# Patient Record
Sex: Female | Born: 1975 | Race: Black or African American | Hispanic: No | Marital: Married | Smoking: Never smoker
Health system: Southern US, Community
[De-identification: ages and names within clinical notes are randomized; demographics above are authoritative.]

## PROBLEM LIST (undated history)

## (undated) DIAGNOSIS — J45909 Unspecified asthma, uncomplicated: Secondary | ICD-10-CM

## (undated) DIAGNOSIS — D259 Leiomyoma of uterus, unspecified: Secondary | ICD-10-CM

## (undated) DIAGNOSIS — M1990 Unspecified osteoarthritis, unspecified site: Secondary | ICD-10-CM

## (undated) DIAGNOSIS — Z9109 Other allergy status, other than to drugs and biological substances: Secondary | ICD-10-CM

## (undated) DIAGNOSIS — Z973 Presence of spectacles and contact lenses: Secondary | ICD-10-CM

## (undated) DIAGNOSIS — T7840XA Allergy, unspecified, initial encounter: Secondary | ICD-10-CM

## (undated) HISTORY — DX: Allergy, unspecified, initial encounter: T78.40XA

## (undated) HISTORY — DX: Unspecified osteoarthritis, unspecified site: M19.90

## (undated) HISTORY — PX: LIPOMA EXCISION: SHX5283

## (undated) HISTORY — DX: Presence of spectacles and contact lenses: Z97.3

## (undated) HISTORY — PX: OTHER SURGICAL HISTORY: SHX169

## (undated) HISTORY — PX: TUBAL LIGATION: SHX77

## (undated) HISTORY — DX: Unspecified asthma, uncomplicated: J45.909

## (undated) HISTORY — PX: WRIST SURGERY: SHX841

## (undated) HISTORY — DX: Leiomyoma of uterus, unspecified: D25.9

---

## 1998-07-13 ENCOUNTER — Other Ambulatory Visit: Admission: RE | Admit: 1998-07-13 | Discharge: 1998-07-13 | Payer: Self-pay | Admitting: Obstetrics and Gynecology

## 1998-07-28 ENCOUNTER — Ambulatory Visit (HOSPITAL_COMMUNITY): Admission: RE | Admit: 1998-07-28 | Discharge: 1998-07-28 | Payer: Self-pay | Admitting: Obstetrics and Gynecology

## 1998-07-28 ENCOUNTER — Encounter: Payer: Self-pay | Admitting: Obstetrics and Gynecology

## 1998-12-10 ENCOUNTER — Inpatient Hospital Stay (HOSPITAL_COMMUNITY): Admission: AD | Admit: 1998-12-10 | Discharge: 1998-12-13 | Payer: Self-pay | Admitting: Obstetrics and Gynecology

## 1998-12-10 ENCOUNTER — Encounter (HOSPITAL_COMMUNITY): Admission: RE | Admit: 1998-12-10 | Discharge: 1998-12-13 | Payer: Self-pay | Admitting: Obstetrics and Gynecology

## 2001-08-28 ENCOUNTER — Other Ambulatory Visit: Admission: RE | Admit: 2001-08-28 | Discharge: 2001-08-28 | Payer: Self-pay | Admitting: Family Medicine

## 2002-08-21 ENCOUNTER — Encounter (INDEPENDENT_AMBULATORY_CARE_PROVIDER_SITE_OTHER): Payer: Self-pay | Admitting: Specialist

## 2002-08-21 ENCOUNTER — Ambulatory Visit (HOSPITAL_BASED_OUTPATIENT_CLINIC_OR_DEPARTMENT_OTHER): Admission: RE | Admit: 2002-08-21 | Discharge: 2002-08-21 | Payer: Self-pay | Admitting: Orthopedic Surgery

## 2005-08-20 ENCOUNTER — Emergency Department (HOSPITAL_COMMUNITY): Admission: EM | Admit: 2005-08-20 | Discharge: 2005-08-20 | Payer: Self-pay | Admitting: Family Medicine

## 2005-12-18 ENCOUNTER — Emergency Department (HOSPITAL_COMMUNITY): Admission: EM | Admit: 2005-12-18 | Discharge: 2005-12-18 | Payer: Self-pay | Admitting: Emergency Medicine

## 2006-04-23 ENCOUNTER — Other Ambulatory Visit: Admission: RE | Admit: 2006-04-23 | Discharge: 2006-04-23 | Payer: Self-pay | Admitting: Gynecology

## 2006-08-21 ENCOUNTER — Emergency Department (HOSPITAL_COMMUNITY): Admission: EM | Admit: 2006-08-21 | Discharge: 2006-08-21 | Payer: Self-pay | Admitting: Family Medicine

## 2007-01-02 ENCOUNTER — Emergency Department (HOSPITAL_COMMUNITY): Admission: EM | Admit: 2007-01-02 | Discharge: 2007-01-02 | Payer: Self-pay | Admitting: Emergency Medicine

## 2008-04-13 ENCOUNTER — Emergency Department (HOSPITAL_COMMUNITY): Admission: EM | Admit: 2008-04-13 | Discharge: 2008-04-13 | Payer: Self-pay | Admitting: Family Medicine

## 2008-08-25 ENCOUNTER — Emergency Department (HOSPITAL_COMMUNITY): Admission: EM | Admit: 2008-08-25 | Discharge: 2008-08-25 | Payer: Self-pay | Admitting: Family Medicine

## 2008-12-22 ENCOUNTER — Inpatient Hospital Stay (HOSPITAL_COMMUNITY): Admission: AD | Admit: 2008-12-22 | Discharge: 2008-12-22 | Payer: Self-pay | Admitting: Obstetrics and Gynecology

## 2009-04-26 ENCOUNTER — Inpatient Hospital Stay (HOSPITAL_COMMUNITY): Admission: AD | Admit: 2009-04-26 | Discharge: 2009-04-29 | Payer: Self-pay | Admitting: Obstetrics and Gynecology

## 2009-04-27 ENCOUNTER — Encounter (INDEPENDENT_AMBULATORY_CARE_PROVIDER_SITE_OTHER): Payer: Self-pay | Admitting: Obstetrics and Gynecology

## 2010-06-19 LAB — RPR: RPR Ser Ql: NONREACTIVE

## 2010-06-19 LAB — CBC
HCT: 28.5 % — ABNORMAL LOW (ref 36.0–46.0)
HCT: 39.3 % (ref 36.0–46.0)
Hemoglobin: 13.3 g/dL (ref 12.0–15.0)
Hemoglobin: 9.9 g/dL — ABNORMAL LOW (ref 12.0–15.0)
MCHC: 33.9 g/dL (ref 30.0–36.0)
MCHC: 34.7 g/dL (ref 30.0–36.0)
MCV: 97.9 fL (ref 78.0–100.0)
MCV: 98.2 fL (ref 78.0–100.0)
Platelets: 158 10*3/uL (ref 150–400)
Platelets: 198 10*3/uL (ref 150–400)
RBC: 2.9 MIL/uL — ABNORMAL LOW (ref 3.87–5.11)
RBC: 4.01 MIL/uL (ref 3.87–5.11)
RDW: 14.2 % (ref 11.5–15.5)
RDW: 14.2 % (ref 11.5–15.5)
WBC: 11.5 10*3/uL — ABNORMAL HIGH (ref 4.0–10.5)
WBC: 9.1 10*3/uL (ref 4.0–10.5)

## 2010-07-08 LAB — CBC
MCHC: 34.4 g/dL (ref 30.0–36.0)
RBC: 3.81 MIL/uL — ABNORMAL LOW (ref 3.87–5.11)
RDW: 13.7 % (ref 11.5–15.5)

## 2010-07-08 LAB — DIFFERENTIAL
Basophils Absolute: 0.1 10*3/uL (ref 0.0–0.1)
Basophils Relative: 1 % (ref 0–1)
Lymphocytes Relative: 14 % (ref 12–46)
Monocytes Relative: 6 % (ref 3–12)
Neutro Abs: 10.2 10*3/uL — ABNORMAL HIGH (ref 1.7–7.7)
Neutrophils Relative %: 76 % (ref 43–77)

## 2010-07-08 LAB — URINALYSIS, ROUTINE W REFLEX MICROSCOPIC
Glucose, UA: NEGATIVE mg/dL
Hgb urine dipstick: NEGATIVE
Specific Gravity, Urine: 1.025 (ref 1.005–1.030)
Urobilinogen, UA: 1 mg/dL (ref 0.0–1.0)
pH: 6 (ref 5.0–8.0)

## 2010-07-08 LAB — URINE MICROSCOPIC-ADD ON

## 2010-07-12 LAB — POCT URINALYSIS DIP (DEVICE)
Bilirubin Urine: NEGATIVE
Glucose, UA: NEGATIVE mg/dL
Nitrite: NEGATIVE

## 2010-08-19 NOTE — Op Note (Signed)
NAME:  Claudia Hendrix, Claudia Hendrix                        ACCOUNT NO.:  [id number]   MEDICAL RECORD NO.:  [id number]                   PATIENT TYPE:  AMB   LOCATION:  DSC                                  FACILITY:  [facility]   PHYSICIAN:  Loreta Ave, M.D.              DATE OF BIRTH:  [birth year]   DATE OF PROCEDURE:  08/21/2002  DATE OF DISCHARGE:                                 OPERATIVE REPORT   PREOPERATIVE DIAGNOSIS:  Recurrent ganglion dorsal aspect of the right  wrist, slightly distal to previous ganglion.   POSTOPERATIVE DIAGNOSIS:  Recurrent ganglion dorsal aspect of the right  wrist, slightly distal to previous ganglion.   OPERATION:  Excision ganglion right wrist.   SURGEON:  Loreta Ave, M.D.   ASSISTANT:  Arlys John D. Petrarca, P.A.-C.   ANESTHESIA:  General   ESTIMATED BLOOD LOSS:  Minimal.   TOURNIQUET TIME:  30 minutes   SPECIMENS:  Excised ganglion.   CULTURES:  None   COMPLICATIONS:  None.   DRESSINGS:  Soft compressive with bulky hand dressing and splint.   DESCRIPTION OF PROCEDURE:  The patient was brought to the operating room and  placed on the operating table in supine position.  After adequate anesthesia  had been obtained, tourniquet applied to the upper aspect of the right arm,  prepped and draped in the usual sterile fashion.  Ganglion which was  emanating near the dorsal compartment just distal to the extensor  retinaculum and distal to the previous incision was approached utilizing the  previous incision which was opened and then retractors used to bring this  over top of the ganglion.  Sensory nerves were protected.  Ganglion had a  bilobular appearance, coming out from the fourth dorsal compartment.  It was  carefully dissected in its entirety.  Typical findings of a ganglion but  with some traumatic bleeding within the ganglion fluid once it was opened.  It was excised in its entirety, traced down to the dorsal wrist capsule  where it was  emanating from the dorsal aspect of the lunate.  Small window  was made in the wrist.  Other structures in the wrist normal, except for  some very mild erosive changes on the lunate underlying the ganglion.  That  was curetted of all inflammatory debride.  The wound irrigated.  All  recesses thoroughly explored.  Extensor tendons were protected throughout.  After the wound was irrigated, the skin and subcutaneous tissue were closed  with Vicryl and  Nylon.  Margins of the wound were injected with Marcaine without  epinephrine.  Sterile compressive dressing applied with a bulky hand  dressing and splint.  Tourniquet inflated and removed.  Anesthesia reversed.  Brought to the recovery room.  Tolerated the surgery well with no  complications.  Loreta Ave, M.D.    DFM/MEDQ  D:  08/21/2002  T:  08/22/2002  Job:  [id number]

## 2011-01-12 LAB — POCT URINALYSIS DIP (DEVICE)
Bilirubin Urine: NEGATIVE
Glucose, UA: NEGATIVE
Nitrite: NEGATIVE

## 2011-01-12 LAB — POCT PREGNANCY, URINE
Operator id: 247071
Preg Test, Ur: NEGATIVE

## 2011-01-12 LAB — WET PREP, GENITAL: Yeast Wet Prep HPF POC: NONE SEEN

## 2011-04-11 ENCOUNTER — Emergency Department (INDEPENDENT_AMBULATORY_CARE_PROVIDER_SITE_OTHER)
Admission: EM | Admit: 2011-04-11 | Discharge: 2011-04-11 | Disposition: A | Discharge status: Home / Self Care | Payer: Self-pay | Source: Home / Self Care | Attending: Emergency Medicine | Admitting: Emergency Medicine

## 2011-04-11 ENCOUNTER — Encounter: Payer: Self-pay | Admitting: *Deleted

## 2011-04-11 DIAGNOSIS — J069 Acute upper respiratory infection, unspecified: Secondary | ICD-10-CM

## 2011-04-11 HISTORY — DX: Other allergy status, other than to drugs and biological substances: Z91.09

## 2011-04-11 MED ORDER — ACETAMINOPHEN-CODEINE #3 300-30 MG PO TABS: 1.0000 | ORAL_TABLET | Freq: Four times a day (QID) | ORAL | Status: AC | PRN

## 2011-04-11 MED ORDER — FEXOFENADINE-PSEUDOEPHED ER 60-120 MG PO TB12: 1.0000 | ORAL_TABLET | Freq: Two times a day (BID) | ORAL | Status: AC

## 2011-04-11 NOTE — ED Notes (Signed)
Sore throat the last 5 days without fever.  Pt also has a productive cough and sinus congestion.

## 2011-04-11 NOTE — ED Provider Notes (Signed)
History     CSN: [id number]  Arrival date & time 04/11/11  6213   First MD Initiated Contact with Patient 04/11/11 1009      Chief Complaint  Patient presents with  . Sore Throat    (Consider location/radiation/quality/duration/timing/severity/associated sxs/prior treatment) HPI Comments: Coughing up phlegm, (yellow), sinus congestion and a sore throat" No fevers No SOB  Patient is a 36 y.o. female presenting with pharyngitis. The history is provided by the patient.  Sore Throat This is a new problem. The current episode started more than 2 days ago. The problem occurs constantly. The problem has not changed since onset.Pertinent negatives include no chest pain, no abdominal pain, no headaches and no shortness of breath. The symptoms are aggravated by swallowing. She has tried nothing for the symptoms.    Past Medical History  Diagnosis Date  . Environmental allergies     Past Surgical History  Procedure Date  . Cesarean section   . Wrist surgery     History reviewed. No pertinent family history.  History  Substance Use Topics  . Smoking status: Never Smoker   . Smokeless tobacco: Not on file  . Alcohol Use: No    OB History    Grav Para Term Preterm Abortions TAB SAB Ect Mult Living                  Review of Systems  Constitutional: Negative for fever, appetite change and fatigue.  HENT: Positive for ear pain, congestion and rhinorrhea. Negative for sneezing and postnasal drip.   Eyes: Positive for photophobia.  Respiratory: Positive for cough. Negative for chest tightness, shortness of breath and wheezing.   Cardiovascular: Negative for chest pain.  Gastrointestinal: Negative for abdominal pain.  Musculoskeletal: Negative for myalgias and arthralgias.  Neurological: Negative for headaches.    Allergies  Review of patient's allergies indicates no known allergies.  Home Medications   Current Outpatient Rx  Name Route Sig Dispense Refill  .  ACETAMINOPHEN-CODEINE #3 300-30 MG PO TABS Oral Take 1-2 tablets by mouth every 6 (six) hours as needed for pain. 15 tablet 0  . FEXOFENADINE-PSEUDOEPHED ER 60-120 MG PO TB12 Oral Take 1 tablet by mouth every 12 (twelve) hours. 15 tablet 0    BP 136/78  Pulse 96  Temp(Src) 98.4 F (36.9 C) (Oral)  Resp 16  SpO2 99%  LMP 03/21/2011  Physical Exam  Nursing note and vitals reviewed. Constitutional: Vital signs are normal. She appears well-developed and well-nourished.  Non-toxic appearance. She does not have a sickly appearance. She does not appear ill. No distress.  HENT:  Head: Normocephalic.  Right Ear: Tympanic membrane, external ear and ear canal normal.  Left Ear: Hearing, tympanic membrane, external ear and ear canal normal.  Nose: Rhinorrhea present.  Mouth/Throat: Uvula is midline and mucous membranes are normal. Posterior oropharyngeal erythema present. No oropharyngeal exudate or posterior oropharyngeal edema.  Eyes: Conjunctivae are normal. Right eye exhibits no discharge. Left eye exhibits no discharge.  Neck: Normal range of motion. Neck supple.  Cardiovascular: Normal rate.   Pulmonary/Chest: Effort normal. No respiratory distress. She has no decreased breath sounds. She has no wheezes. She has no rhonchi. She has no rales.  Lymphadenopathy:    She has no cervical adenopathy.  Skin: No rash noted. No erythema.    ED Course  Procedures (including critical care time)  Labs Reviewed - No data to display No results found.   1. Upper respiratory virus  MDM  URI, SX's  Afebrile- comfortable        Jimmie Molly, MD 04/11/11 1039

## 2012-04-05 ENCOUNTER — Encounter: Payer: Self-pay | Admitting: Family Medicine

## 2012-04-05 ENCOUNTER — Ambulatory Visit (INDEPENDENT_AMBULATORY_CARE_PROVIDER_SITE_OTHER): Payer: Self-pay | Admitting: Family Medicine

## 2012-04-05 VITALS — BP 125/80 | HR 91 | Temp 98.2°F | Ht 58.5 in | Wt 155.0 lb

## 2012-04-05 DIAGNOSIS — D259 Leiomyoma of uterus, unspecified: Secondary | ICD-10-CM

## 2012-04-05 DIAGNOSIS — M79609 Pain in unspecified limb: Secondary | ICD-10-CM

## 2012-04-05 DIAGNOSIS — E669 Obesity, unspecified: Secondary | ICD-10-CM

## 2012-04-05 DIAGNOSIS — D219 Benign neoplasm of connective and other soft tissue, unspecified: Secondary | ICD-10-CM

## 2012-04-05 DIAGNOSIS — M79601 Pain in right arm: Secondary | ICD-10-CM

## 2012-04-05 NOTE — Progress Notes (Signed)
Subjective:     Patient ID: Claudia Hendrix, female   DOB: [birth year], 37 y.o.   MRN: [id number]  CC: Establish care  HPI Claudia Hendrix is a 37 y.o. female with h/o uterine fibroids here to establish care.  Her only complaint today is 4-5 years of 5/10 pain and tightness near her right triceps muscle.  It has gotten worse in the last 2 years, especially if she lifts anything with that arm.  She feels a "knot" at her right triceps though this itself is not erythematous, hot, or painful.  She also feels some right hip pain that she attributes to weight.  Review of Systems Patient denies fevers, chills, chest pain, SOB, nausea, vomiting, diarrhea.  Has had some wheezing with night air, and throat burning.  PMH:  - [facility] [place] - stopped going, wanted to find something closer to home - PMH: Fibroids - Medication: PNV for hair, Birth control (sprintec) daily - Hospitalizations: Only for deliveries (2) - Surgical hx: c-section 04/2009 with fibriod removal (3 fibroids) - OB/Gyn hx: Pap smears always negative, most recent in 2012 --OB: G2P2, 1st was NSVD with no complications, 2nd was c-section because breech and heart rate issues, and maternal elevated BP  NKDA  FH: - Uncle with birth defects (uncle born developmentally delayed) - DM in uncle - HTN, thyroid disease in aunts - PGM died from DM - Grandparent with stroke 4s, HTN - Mother kidney disease 9 yo- death from sepsis  SH: - Never smoker, no alcohol, no drug use reported - Lives with husband, dtr, son, feels safe at home/denies DV - Does not exercise regularly, wants to start this year - One sexual partner for many years and she is his only sexual partner - Work as Education officer, environmental houses - Loves ice cream    Objective:   Physical Exam BP 125/80  Pulse 91  Temp 98.2 F (36.8 C) (Oral)  Ht 4' 10.5" (1.486 m)  Wt 155 lb (70.308 kg)  BMI 31.84 kg/m2  LMP 03/30/2012 GEN: NAD, pleasant, sitting in  chair CV: RRR, no m/r/g PULM: CTAB, no wheezes or crackles ABD: soft, nontender, nondistended EXTR/MSK: right arm feels tight at biceps to patient when extends and externally rotates; soft raised area at triceps that feels like lipoma, nonerythematous, nontender, not hot; no clubbing or edema SKIN: No rashes or cyanosis NEURO: No focal deficits, alert and oriented, normal gait and speech    Assessment:     Claudia Hendrix is a 37 y.o. female with h/o uterine fibroids here to establish care.    Plan:     # Health maintenance: - Declined flu shot today - States other immunizations UTD - Evaluate wheezing at f/u visit

## 2012-04-07 ENCOUNTER — Encounter: Payer: Self-pay | Admitting: Family Medicine

## 2012-04-07 DIAGNOSIS — D219 Benign neoplasm of connective and other soft tissue, unspecified: Secondary | ICD-10-CM | POA: Insufficient documentation

## 2012-04-07 DIAGNOSIS — E669 Obesity, unspecified: Secondary | ICD-10-CM | POA: Insufficient documentation

## 2012-04-07 DIAGNOSIS — M79601 Pain in right arm: Secondary | ICD-10-CM | POA: Insufficient documentation

## 2012-04-07 NOTE — Assessment & Plan Note (Signed)
Patient aware that weight can contribute to hip / joint pain - Plan to slowly make lifestyle changes, including cutting back on ice cream and sweet beverages and walking 10 minutes 1-2 times per day after this visit

## 2012-04-07 NOTE — Assessment & Plan Note (Signed)
Right arm also with soft nontender and nonerythematous/hot/inflamed-appearing "cyst" - Evaluate at f/u in 2-3 months

## 2012-07-01 ENCOUNTER — Encounter (INDEPENDENT_AMBULATORY_CARE_PROVIDER_SITE_OTHER): Payer: Self-pay | Admitting: General Surgery

## 2012-07-04 ENCOUNTER — Ambulatory Visit (INDEPENDENT_AMBULATORY_CARE_PROVIDER_SITE_OTHER): Payer: BC Managed Care – PPO | Admitting: General Surgery

## 2012-07-04 ENCOUNTER — Encounter (INDEPENDENT_AMBULATORY_CARE_PROVIDER_SITE_OTHER): Payer: Self-pay | Admitting: General Surgery

## 2012-07-04 VITALS — BP 126/74 | HR 84 | Temp 97.3°F | Resp 18 | Ht 59.0 in | Wt 154.0 lb

## 2012-07-04 DIAGNOSIS — R2231 Localized swelling, mass and lump, right upper limb: Secondary | ICD-10-CM

## 2012-07-04 DIAGNOSIS — R229 Localized swelling, mass and lump, unspecified: Secondary | ICD-10-CM

## 2012-07-04 NOTE — Progress Notes (Signed)
Patient ID: Claudia Hendrix, female   DOB: Oct 09, 1975, 37 y.o.   MRN: [id number]  Chief Complaint  Patient presents with  . New Evaluation    rt arm cyst    HPI Claudia Hendrix is a 37 y.o. female.  The patient is a 37 year old female who was referred by Dr. Delane Ginger an evaluation of her right arm subcutaneous mass. The patient states that the mass is been there for several years. She doesn't been getting bigger in size over the last several weeks. There has not been any drainage, erythema, but has become more painful. HPI  Past Medical History  Diagnosis Date  . Environmental allergies   . Fibroid, uterine   . Thyroid disease   . Cancer   . Arthritis   . Diabetes mellitus without complication   . Chronic kidney disease     Past Surgical History  Procedure Laterality Date  . Wrist surgery    . Cesarean section  2011    With fibroid removal  . Wrist cyst      Family History  Problem Relation Age of Onset  . Kidney disease Mother 62  . Diabetes Paternal Grandmother   . Cancer Paternal Grandmother     lung  . Birth defects Other     Developmental delay  . Diabetes Other   . Hypertension Other   . Thyroid disease Other   . Stroke Other   . Hypertension Other   . Hypertension Maternal Grandfather     Social History History  Substance Use Topics  . Smoking status: Never Smoker   . Smokeless tobacco: Not on file  . Alcohol Use: No    No Known Allergies  Current Outpatient Prescriptions  Medication Sig Dispense Refill  . albuterol (ACCUNEB) 0.63 MG/3ML nebulizer solution Take 1 ampule by nebulization every 6 (six) hours as needed for wheezing.      . norgestimate-ethinyl estradiol (ORTHO-CYCLEN,SPRINTEC,PREVIFEM) 0.25-35 MG-MCG tablet Take 1 tablet by mouth daily.       No current facility-administered medications for this visit.    Review of Systems Review of Systems  Constitutional: Negative.   HENT: Negative.   Eyes: Negative.   Respiratory: Negative.    Cardiovascular: Negative.   Gastrointestinal: Negative.   Endocrine: Negative.   Neurological: Negative.     Blood pressure 126/74, pulse 84, temperature 97.3 F (36.3 C), resp. rate 18, height 4\' 11"  (1.499 m), weight 154 lb (69.854 kg).  Physical Exam Physical Exam  Constitutional: She is oriented to person, place, and time. She appears well-developed and well-nourished.  HENT:  Head: Normocephalic and atraumatic.  Eyes: Conjunctivae and EOM are normal. Pupils are equal, round, and reactive to light.  Neck: Normal range of motion. Neck supple.  Cardiovascular: Normal rate, regular rhythm and normal heart sounds.   Pulmonary/Chest: Effort normal and breath sounds normal.  Abdominal: Soft. Bowel sounds are normal.  Musculoskeletal: Normal range of motion.  Neurological: She is alert and oriented to person, place, and time.  Skin:       Data Reviewed none  Assessment    37 year old female with a right upper arm subcutaneous mass, likely lipoma.     Plan    1.Will proceed to the operating room for excision of a right upper arm mass.  2. We discussed the risks of surgery to include infection, bleeding, recurrence, need for further operations if that should this be cancerous, and damage to surrounding structures. The patient voiced understanding of these risks and  wished to proceed with the procedure.       Marigene Ehlers., Ebonie Westerlund 07/04/2012, 9:53 AM

## 2012-07-09 ENCOUNTER — Other Ambulatory Visit (INDEPENDENT_AMBULATORY_CARE_PROVIDER_SITE_OTHER): Payer: Self-pay | Admitting: General Surgery

## 2012-07-09 DIAGNOSIS — D1739 Benign lipomatous neoplasm of skin and subcutaneous tissue of other sites: Secondary | ICD-10-CM

## 2012-07-10 ENCOUNTER — Telehealth (INDEPENDENT_AMBULATORY_CARE_PROVIDER_SITE_OTHER): Payer: Self-pay | Admitting: General Surgery

## 2012-07-10 ENCOUNTER — Other Ambulatory Visit: Payer: Self-pay | Admitting: Sports Medicine

## 2012-07-10 DIAGNOSIS — M25521 Pain in right elbow: Secondary | ICD-10-CM

## 2012-07-10 NOTE — Telephone Encounter (Signed)
Spoke with patient she is aware of po f/u appt 07/25/12 11:20

## 2012-07-16 ENCOUNTER — Inpatient Hospital Stay: Admission: RE | Admit: 2012-07-16 | Payer: Self-pay | Source: Ambulatory Visit

## 2012-07-18 ENCOUNTER — Ambulatory Visit
Admission: RE | Admit: 2012-07-18 | Discharge: 2012-07-18 | Disposition: A | Discharge status: Home / Self Care | Payer: BC Managed Care – PPO | Source: Ambulatory Visit | Attending: Sports Medicine | Admitting: Sports Medicine

## 2012-07-18 DIAGNOSIS — M25521 Pain in right elbow: Secondary | ICD-10-CM

## 2012-07-25 ENCOUNTER — Encounter (INDEPENDENT_AMBULATORY_CARE_PROVIDER_SITE_OTHER): Payer: BC Managed Care – PPO | Admitting: General Surgery

## 2012-07-26 ENCOUNTER — Ambulatory Visit
Admission: RE | Admit: 2012-07-26 | Discharge: 2012-07-26 | Disposition: A | Discharge status: Home / Self Care | Payer: BC Managed Care – PPO | Source: Ambulatory Visit | Attending: Sports Medicine | Admitting: Sports Medicine

## 2012-07-26 MED ORDER — GADOBENATE DIMEGLUMINE 529 MG/ML IV SOLN
14.0000 mL | Freq: Once | INTRAVENOUS | Status: AC | PRN
  Administered 2012-07-26: 14 mL via INTRAVENOUS

## 2012-08-07 ENCOUNTER — Telehealth (INDEPENDENT_AMBULATORY_CARE_PROVIDER_SITE_OTHER): Payer: Self-pay

## 2012-08-07 ENCOUNTER — Ambulatory Visit (INDEPENDENT_AMBULATORY_CARE_PROVIDER_SITE_OTHER): Payer: BC Managed Care – PPO | Admitting: General Surgery

## 2012-08-07 ENCOUNTER — Encounter (INDEPENDENT_AMBULATORY_CARE_PROVIDER_SITE_OTHER): Payer: Self-pay | Admitting: General Surgery

## 2012-08-07 VITALS — BP 118/62 | HR 96 | Temp 98.0°F | Resp 18 | Ht 59.0 in | Wt 154.0 lb

## 2012-08-07 DIAGNOSIS — D179 Benign lipomatous neoplasm, unspecified: Secondary | ICD-10-CM

## 2012-08-07 NOTE — Telephone Encounter (Signed)
Patient agreeable and will be here.

## 2012-08-07 NOTE — Progress Notes (Signed)
Patient ID: Claudia Hendrix, female   DOB: 07/03/1975, 37 y.o.   MRN: [id number] The patient is 37 year old female status post right arm mass removal. The patient has been doing well postoperatively. The patient initially had some edema and her elbow which subsequently resolved after the operation. The patient also was seen by orthopedics for bone spurs in her elbow.  On exam: Her wound is clean dry and intact  Pathology: Lipoma. This was discussed with the patient.  37 year old female status post right lipoma removal 1. Patient follow up when necessary

## 2012-08-07 NOTE — Telephone Encounter (Signed)
Called and left message for patient re: see if patient can come in for a 4:00 pm appt w/arrival @ 3:45 today w/Dr. Derrell Lolling.

## 2012-11-22 ENCOUNTER — Encounter (HOSPITAL_COMMUNITY): Payer: Self-pay | Admitting: Obstetrics and Gynecology

## 2012-11-29 NOTE — H&P (Signed)
  Patient Claudia Hendrix, [facility] CSN# [id number]  [facility], MD 11/29/2012 8:16 AM

## 2012-12-03 ENCOUNTER — Encounter (HOSPITAL_COMMUNITY): Payer: Self-pay | Admitting: Pharmacist

## 2012-12-19 ENCOUNTER — Ambulatory Visit: Admit: 2012-12-19 | Payer: BC Managed Care – PPO | Admitting: Obstetrics and Gynecology

## 2012-12-19 SURGERY — LIGATION, FALLOPIAN TUBE, LAPAROSCOPIC
Anesthesia: General | Laterality: Bilateral

## 2013-03-07 ENCOUNTER — Encounter: Payer: Self-pay | Admitting: Medical

## 2013-03-07 ENCOUNTER — Ambulatory Visit
Admission: RE | Admit: 2013-03-07 | Discharge: 2013-03-07 | Disposition: A | Discharge status: Home / Self Care | Payer: BC Managed Care – PPO | Source: Ambulatory Visit | Attending: Medical | Admitting: Medical

## 2013-03-07 ENCOUNTER — Ambulatory Visit (INDEPENDENT_AMBULATORY_CARE_PROVIDER_SITE_OTHER): Payer: BC Managed Care – PPO | Admitting: Medical

## 2013-03-07 ENCOUNTER — Telehealth: Payer: Self-pay | Admitting: Medical

## 2013-03-07 VITALS — BP 122/78 | HR 100 | Temp 98.3°F | Resp 18 | Wt 157.0 lb

## 2013-03-07 DIAGNOSIS — R062 Wheezing: Secondary | ICD-10-CM

## 2013-03-07 DIAGNOSIS — R0602 Shortness of breath: Secondary | ICD-10-CM

## 2013-03-07 LAB — CBC WITH DIFFERENTIAL/PLATELET
Eosinophils Absolute: 0.8 10*3/uL — ABNORMAL HIGH (ref 0.0–0.7)
Eosinophils Relative: 8 % — ABNORMAL HIGH (ref 0–5)
Hemoglobin: 13.7 g/dL (ref 12.0–15.0)
Lymphs Abs: 3.7 10*3/uL (ref 0.7–4.0)
MCH: 30.5 pg (ref 26.0–34.0)
MCV: 88.2 fL (ref 78.0–100.0)
Monocytes Relative: 5 % (ref 3–12)
Neutrophils Relative %: 50 % (ref 43–77)
RBC: 4.49 MIL/uL (ref 3.87–5.11)

## 2013-03-07 MED ORDER — METHYLPREDNISOLONE ACETATE 40 MG/ML IJ SUSP
60.0000 mg | Freq: Once | INTRAMUSCULAR | Status: AC
  Administered 2013-03-07: 60 mg via INTRAMUSCULAR

## 2013-03-07 MED ORDER — MOMETASONE FURO-FORMOTEROL FUM 100-5 MCG/ACT IN AERO: 2.0000 | INHALATION_SPRAY | Freq: Two times a day (BID) | RESPIRATORY_TRACT | Status: DC

## 2013-03-07 NOTE — Telephone Encounter (Signed)
lm

## 2013-03-07 NOTE — Progress Notes (Signed)
Subjective:    Claudia Hendrix is a 37 y.o. female who presents as a new patient for evaluation of shortness of breath.   She denies a history of heart or lung disease, but about a year ago was treated for bronchitis by another doctor, given albuterol and antibiotic.  Since then her gynecologist has prescribed her an albuterol inhaler periodically throughout the year for wheezing.  She notes over the past year having to use the inhaler at various times.  This week she has had dyspnea moderate the last few days, and generally lasting throughout the day. Episodes usually occur intermittently and have been gradually worsening. Associated symptoms include: wheezing. The symptoms are aggravated by inhalation of clorox, sometimes cold weather, and relieved by inhaler: albuterol.  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 She thinks she and her daughter may have asthma, as her daughter has frequent wheezing and cough, also worsened by Clorox. She denies any other URI symptoms at this time, no chest pain, swelling in the legs, no palpitations.  Patient's cardiac risk factors are: obesity (BMI >= 30 kg/m2). Patient's risk factors for DVT/PE: oral contraceptive use. Previous cardiac testing: none.  The following portions of the patient's history were reviewed and updated as appropriate: allergies, current medications, past family history, past medical history, past social history, past surgical history and problem list.  Review of Systems As in history of present illness   Past Medical History  Diagnosis Date  . Environmental allergies   . Fibroid, uterine   . Thyroid disease   . Wears glasses     Objective:   BP 122/78  Pulse 100  Temp(Src) 98.3 F (36.8 C) (Oral)  Resp 18  Wt 157 lb (71.215 kg)  SpO2 97% Repeat pulse ox 98% on room air, 82 pulse after the first round of albuterol nebulizer  General appearance: alert, no distress, WD/WN, AA female   HEENT: normocephalic, conjunctiva/corneas normal, sclerae anicteric, PERRLA, EOMi, nares patent, no discharge or erythema, pharynx normal Oral cavity: MMM, tongue normal, teeth normal Neck: supple, no lymphadenopathy, no thyromegaly, no JVD, no bruits, no masses Chest: non tender, normal shape and expansion Heart: RRR, normal S1, S2, no murmurs Lungs: Diffuse wheezes, scattered rhonchi, no rales Abdomen: +bs, soft, non tender, non distended, no masses, no hepatomegaly, no splenomegaly, no bruits Extremities: no edema, no cyanosis, no clubbing Pulses: 2+ symmetric, upper and lower extremities, normal cap refill  Assessment: Encounter Diagnoses  Name Primary?  . Wheezing Yes  . SOB (shortness of breath)    Plan: Shortly after brief history we started her on one round of albuterol nebulized treatment with relatively good improvement. We're going to do a second round of albuterol nebulizer 10 minutes later when she used her albuterol inhaler in the room.   We discussed her symptoms, differential for wheezing and shortness of breath, exam findings today. I suspect asthma, or at least chemical induced pneumonitis. Of note, she is on OCP. We will check a CBC and d-dimer, send for chest x-ray. We discussed the possibility of doing chest CT if there is still suspicion of PE within the next 24 hours.  At this point she'll begin sample of Dulera inhaler, 60 mg of Depo-Medrol given in the office, she has her albuterol inhaler. We discussed the difference between the [facility] inhaler and  albuterol inhalers, and she is aware that the albuterol as rescue/emergency use and Dulera is maintenance therapy.  Rinse mouth out with water  after use of Dulera.  We will also send for PFTs. Followup pending studies, recheck or go to the emergency department if worse.

## 2013-03-07 NOTE — Telephone Encounter (Signed)
Message copied by Ruffin Frederick on Fri Mar 07, 2013  4:29 PM ------      Message from: Jac Canavan      Created: Fri Mar 07, 2013  4:21 PM       pls let her know that I will be waiting on the labs.  Good new - chest xray is normal ------

## 2013-03-08 LAB — D-DIMER, QUANTITATIVE: D-Dimer, Quant: 1.1 ug/mL-FEU — ABNORMAL HIGH (ref 0.00–0.48)

## 2013-03-11 ENCOUNTER — Other Ambulatory Visit: Payer: Self-pay | Admitting: Family Medicine

## 2013-03-11 DIAGNOSIS — R062 Wheezing: Secondary | ICD-10-CM

## 2013-03-12 ENCOUNTER — Encounter (INDEPENDENT_AMBULATORY_CARE_PROVIDER_SITE_OTHER): Payer: BC Managed Care – PPO | Admitting: Internal Medicine

## 2013-03-12 DIAGNOSIS — R062 Wheezing: Secondary | ICD-10-CM

## 2013-03-12 LAB — PULMONARY FUNCTION TEST

## 2013-05-04 DIAGNOSIS — J45909 Unspecified asthma, uncomplicated: Secondary | ICD-10-CM

## 2013-05-04 HISTORY — DX: Unspecified asthma, uncomplicated: J45.909

## 2013-05-06 ENCOUNTER — Other Ambulatory Visit: Payer: Self-pay | Admitting: Medical

## 2013-05-06 ENCOUNTER — Telehealth: Payer: Self-pay | Admitting: Family Medicine

## 2013-05-06 MED ORDER — ALBUTEROL SULFATE HFA 108 (90 BASE) MCG/ACT IN AERS: 2.0000 | INHALATION_SPRAY | Freq: Four times a day (QID) | RESPIRATORY_TRACT | Status: DC | PRN

## 2013-05-06 NOTE — Telephone Encounter (Signed)
Did she ever go for PFTs/spirometry?  I'll resend inhaler, but need to know about the PFTs

## 2013-05-06 NOTE — Telephone Encounter (Signed)
Pt called she has finished the inahler and the wheezing is coming back.  What do you want her to do? Pt ph [phone].      Pharm is [facility] on [address].

## 2013-05-07 NOTE — Telephone Encounter (Signed)
Patient is aware of the medication that was sent to the pharmacy. CLS 

## 2013-05-07 NOTE — Telephone Encounter (Signed)
i sent the albuterol she can use now, but do we have the PFTs?

## 2013-05-07 NOTE — Telephone Encounter (Signed)
See msg

## 2013-05-07 NOTE — Telephone Encounter (Signed)
Patient states that she finish all of the [facility] and she has her old inhaler the Proventil. She wants to know if you want her to restart the Proventil. She did have her PFT'S on 03/12/13. [place]

## 2013-05-07 NOTE — Telephone Encounter (Signed)
I DON"T have the PFTs. Please find them!

## 2013-05-09 NOTE — Telephone Encounter (Signed)
Claudia Hendrix is faxing over her PFT's from December. CLS

## 2013-05-09 NOTE — Telephone Encounter (Signed)
Pt will call back Monday and schedule an appt for follow-up. She did not have her schedule in front of her

## 2013-05-09 NOTE — Telephone Encounter (Signed)
I finally got the lung study result.  Not sure what we didn't get the result promptly back in December.  Have her f/u at her earliest convenience for recheck on breathing/inhaler.

## 2013-05-12 ENCOUNTER — Other Ambulatory Visit: Payer: Self-pay | Admitting: Family Medicine

## 2013-05-12 ENCOUNTER — Telehealth: Payer: Self-pay | Admitting: Medical

## 2013-05-12 MED ORDER — MOMETASONE FURO-FORMOTEROL FUM 100-5 MCG/ACT IN AERO: 2.0000 | INHALATION_SPRAY | Freq: Two times a day (BID) | RESPIRATORY_TRACT | Status: DC

## 2013-05-12 NOTE — Telephone Encounter (Signed)
You can call out dulera, but she needs f/u visit.   I saw her for SOB, and we need to f/u on breathing, PFTs, discuss managing these symptoms.

## 2013-05-12 NOTE — Telephone Encounter (Signed)
She called and requested refill on inhaler.. proventil inhaler was called in but she already has that one, she needs the [place] refilled     [place]

## 2013-05-12 NOTE — Telephone Encounter (Signed)
Patient is aware and she has an appointment to follow up here on 05/14/13. I left the patient a sample up front to pick up. CLS

## 2013-05-14 ENCOUNTER — Encounter: Payer: Self-pay | Admitting: Medical

## 2013-05-14 ENCOUNTER — Ambulatory Visit (INDEPENDENT_AMBULATORY_CARE_PROVIDER_SITE_OTHER): Payer: BC Managed Care – PPO | Admitting: Medical

## 2013-05-14 VITALS — BP 110/60 | HR 72 | Temp 97.5°F | Resp 14 | Ht 59.0 in | Wt 160.0 lb

## 2013-05-14 DIAGNOSIS — J309 Allergic rhinitis, unspecified: Secondary | ICD-10-CM

## 2013-05-14 DIAGNOSIS — D721 Eosinophilia, unspecified: Secondary | ICD-10-CM

## 2013-05-14 DIAGNOSIS — J45901 Unspecified asthma with (acute) exacerbation: Secondary | ICD-10-CM | POA: Insufficient documentation

## 2013-05-14 DIAGNOSIS — J45909 Unspecified asthma, uncomplicated: Secondary | ICD-10-CM

## 2013-05-14 MED ORDER — ALBUTEROL SULFATE (2.5 MG/3ML) 0.083% IN NEBU: 2.5000 mg | INHALATION_SOLUTION | Freq: Four times a day (QID) | RESPIRATORY_TRACT | Status: DC | PRN

## 2013-05-14 MED ORDER — CETIRIZINE HCL 10 MG PO TABS: 10.0000 mg | ORAL_TABLET | Freq: Every day | ORAL | Status: DC

## 2013-05-14 MED ORDER — FLUTICASONE PROPIONATE 50 MCG/ACT NA SUSP: 2.0000 | Freq: Every day | NASAL | Status: DC

## 2013-05-14 NOTE — Progress Notes (Signed)
Subjective:    Claudia Hendrix is a 38 y.o. female who presents for f/u on shortness of breath.   I last saw her as a new patient in December.  At that time she was having SOB, dyspnea.   She notes getting significant improvement of her symptoms with Dulera and Albuterol prn.  She denies a history of heart or lung disease, but about a year ago was treated for bronchitis by another doctor, given albuterol and antibiotic.  Since then her gynecologist has prescribed her an albuterol inhaler periodically throughout the year for wheezing.  She notes over the past year having to use the inhaler at various times.  Episodes usually occur intermittently and have been gradually worsening over the past year. Associated symptoms include: wheezing. The symptoms are aggravated by inhalation of clorox, sometimes cold weather, and relieved by inhaler: albuterol.  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 She thinks she and her daughter may have asthma, as her daughter has frequent wheezing and cough, also worsened by Clorox. She denies any other URI symptoms at this time, no chest pain, swelling in the legs, no palpitations.  Patient's cardiac risk factors are: obesity (BMI >= 30 kg/m2). Patient's risk factors for DVT/PE: oral contraceptive use. Previous cardiac testing: none.  The following portions of the patient's history were reviewed and updated as appropriate: allergies, current medications, past family history, past medical history, past social history, past surgical history and problem list.  Review of Systems As in history of present illness    Objective:   BP 110/60  Pulse 72  Temp(Src) 97.5 F (36.4 C)  Resp 14  Ht 4\' 11"  (1.499 m)  Wt 160 lb (72.576 kg)  BMI 32.30 kg/m2   General appearance: alert, no distress, WD/WN, AA female  HEENT: normocephalic, conjunctiva/corneas normal, sclerae anicteric, PERRLA, EOMi, nares with turbinate edema, clear  discharge, pharynx normal Oral cavity: MMM, tongue normal, teeth normal Neck: supple, no lymphadenopathy, no thyromegaly, no JVD, no bruits, no masses Chest: non tender, normal shape and expansion Heart: RRR, normal S1, S2, no murmurs Lungs: few end expiratory wheezes, no rhonchi, no rales Extremities: no edema, no cyanosis, no clubbing Pulses: 2+ symmetric, upper and lower extremities, normal cap refill  Assessment: Encounter Diagnoses  Name Primary?  . Extrinsic asthma, unspecified Yes  . Allergic rhinitis   . Eosinophilia    Plan: I had sent her for PFTs which showed mild obstructive process c/w small airway disease.  Rest of parameters, diffusing capacity, TLC, RV, FEV1 and FVC all normal.   She did have significant response to bronchodilator.  Her CBC in December showed elevated eosinophils .  Advised that with the elevated D-dimer in December, can't rule out PE. However ,she is much improved, and her symptoms have been persistent for > 1 year.  So chance of PE is low.  We discussed that her symptoms and exam strongly suggests allergic asthma.  She certainly has environmental exposures, has perineal allergies.  She also saw significant improvement in [facility] and Albuterol.    Treatment - gave options for allergy testing vs beginning emperic therapy.  She wants to hold off on any more testing.  Thus, advised she begin Cetirizine QHS, FLonase daily, nasal saline flush daily, good hydration, she just started back on Dulera, and c/t albuterol prn either HFA or by nebulizer.   She has nebulizer machine at home.  Avoid triggers.  discusses proper use of medication.  Recheck 52mo,  sooner prn.

## 2013-05-22 ENCOUNTER — Encounter: Payer: Self-pay | Admitting: Medical

## 2013-06-04 ENCOUNTER — Telehealth: Payer: Self-pay | Admitting: Medical

## 2013-06-04 NOTE — Telephone Encounter (Signed)
Due back on asthma at this time anyhow, so given this unusual symptom, lets see her to look at the nose.  The medication/nasal spray should actually help swelling.

## 2013-06-05 NOTE — Telephone Encounter (Signed)
Pt made appt for tomorrow

## 2013-06-06 ENCOUNTER — Encounter: Payer: Self-pay | Admitting: Medical

## 2013-06-06 ENCOUNTER — Ambulatory Visit (INDEPENDENT_AMBULATORY_CARE_PROVIDER_SITE_OTHER): Payer: BC Managed Care – PPO | Admitting: Medical

## 2013-06-06 VITALS — BP 100/70 | HR 66 | Temp 97.8°F | Resp 16 | Wt 158.0 lb

## 2013-06-06 DIAGNOSIS — R040 Epistaxis: Secondary | ICD-10-CM

## 2013-06-06 NOTE — Progress Notes (Signed)
Subjective: Here for concerns about nosebleed.  She had a single nosebleed this week, felt like she is sore in her nose that ruptured.  Was able to stop a nosebleed quickly. She hasn't had any other nosebleed since.  Denies fever, sinus pressure, sore throat, ear pain.  She is using her Flonase and nasal saline since last visit and overall her asthma symptoms have been improved.  No other aggravating or relieving factors.  No other history of nosebleeds   Objective: Filed Vitals:   06/06/13 1541  BP: 100/70  Pulse: 66  Temp: 97.8 F (36.6 C)  Resp: 16    General appearance: alert, no distress, WD/WN HEENT: normocephalic, sclerae anicteric, TMs pearly, nares with mild turbinate edema, some dry mucus, left nare with small amount of dry blood, but no active bleeding, no sores or lesions, no erythema, pharynx normal Oral cavity: MMM, no lesions Neck: supple, no lymphadenopathy, no thyromegaly, no masses  Assessment: Encounter Diagnosis  Name Primary?  . Epistaxis Yes   Plan:  No obvious bleeding today on exam.  I suspect dry air as the culprit versus nasal spray induced irritation. She will use a trial of Vaseline to coat the nares for now.  Advise she continue her routine medications at this time.  Can also use humidifier in her bedroom .  If she has any more nosebleeds within the next 2 weeks despite the Vaseline or humidifier,then she was instructed to stop the nasal sprays for right now and recheck

## 2013-09-16 ENCOUNTER — Encounter: Payer: Self-pay | Admitting: Medical

## 2013-09-16 ENCOUNTER — Ambulatory Visit (INDEPENDENT_AMBULATORY_CARE_PROVIDER_SITE_OTHER): Payer: BC Managed Care – PPO | Admitting: Medical

## 2013-09-16 VITALS — BP 110/70 | HR 88 | Temp 98.2°F | Resp 16 | Wt 167.0 lb

## 2013-09-16 DIAGNOSIS — J45901 Unspecified asthma with (acute) exacerbation: Secondary | ICD-10-CM

## 2013-09-16 MED ORDER — MOMETASONE FURO-FORMOTEROL FUM 100-5 MCG/ACT IN AERO: 2.0000 | INHALATION_SPRAY | Freq: Two times a day (BID) | RESPIRATORY_TRACT | Status: DC

## 2013-09-16 MED ORDER — CETIRIZINE HCL 10 MG PO TABS: 10.0000 mg | ORAL_TABLET | Freq: Every day | ORAL | Status: DC

## 2013-09-16 MED ORDER — FLUTICASONE PROPIONATE 50 MCG/ACT NA SUSP: 2.0000 | Freq: Every day | NASAL | Status: DC

## 2013-09-16 MED ORDER — ALBUTEROL SULFATE HFA 108 (90 BASE) MCG/ACT IN AERS: 2.0000 | INHALATION_SPRAY | Freq: Four times a day (QID) | RESPIRATORY_TRACT | Status: DC | PRN

## 2013-09-16 MED ORDER — ALBUTEROL SULFATE (2.5 MG/3ML) 0.083% IN NEBU: 2.5000 mg | INHALATION_SOLUTION | Freq: Four times a day (QID) | RESPIRATORY_TRACT | Status: DC | PRN

## 2013-09-16 NOTE — Progress Notes (Signed)
   Subjective:   Claudia Hendrix is a 37 y.o. female presenting on 09/16/2013 with Wheezing  Here today as a work in patient for asthma flare.  She ran out of her Dulera samples, didn't feel like she needed any more as her breathing was fine, also not taking her allergy medications.  And for the last 2-3 weeks been having worse shortness of breath and wheezing.  Using albuterol daily. She did a nebulizer albuterol last night.  She has been having sneezing, cough, but no fever no congestion, no other symptoms, no calf pain.   Of note, not currently on birth control.  No other aggravating or relieving factors.  No other complaint.   Review of Systems ROS as in subjective      Objective:    Filed Vitals:   09/16/13 0845  BP: 110/70  Pulse: 88  Temp: 98.2 F (36.8 C)  Resp: 16   Pulse ox 99% room air, pulse 96  General appearance: alert, no distress, WD/WN HEENT: normocephalic, sclerae anicteric, TMs pearly, nares patent, no discharge or erythema, pharynx normal Oral cavity: MMM, no lesions Neck: supple, no lymphadenopathy, no thyromegaly, no masses Heart: RRR, normal S1, S2, no murmurs Lungs: scattered mild wheezes throughout, no rhonchi, no rales, decreased air flow Pulses: 2+ symmetric, upper and lower extremities, normal cap refill Ext: no edema, no palpable cords in the calves       Assessment: Encounter Diagnosis  Name Primary?  Marland Kitchen Asthma with acute exacerbation Yes     Plan: We discussed her symptoms and exam today, seems to have asthma exacerbation due to noncompliance of medication.  Gave one round of albuterol and office with good improvement.  Lungs clear on recheck.   I went back over discussion about prevention versus acute treatment for asthma, controller medications preventative measures such as taking her allergy medicine regularly, avoiding triggers.  Also discussed considering other job opportunities to where she has less chemical exposure.  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  Uses Clorox daily, around animal dander, etc, other triggers.    Restart Dulera, restart allergy medication as below, albuterol as needed   Marca was seen today for wheezing.  Diagnoses and associated orders for this visit:  Asthma with acute exacerbation - Pulse oximetry (single); Future     Return in about 1 month (around 10/16/2013), or if symptoms worsen or fail to improve.

## 2013-09-29 ENCOUNTER — Other Ambulatory Visit: Payer: Self-pay | Admitting: Medical

## 2013-09-29 MED ORDER — MOMETASONE FURO-FORMOTEROL FUM 100-5 MCG/ACT IN AERO: 2.0000 | INHALATION_SPRAY | Freq: Two times a day (BID) | RESPIRATORY_TRACT | Status: DC

## 2013-12-01 ENCOUNTER — Other Ambulatory Visit: Payer: Self-pay | Admitting: Medical

## 2013-12-01 ENCOUNTER — Telehealth: Payer: Self-pay | Admitting: Medical

## 2013-12-01 ENCOUNTER — Telehealth: Payer: Self-pay | Admitting: Internal Medicine

## 2013-12-01 MED ORDER — BUDESONIDE-FORMOTEROL FUMARATE 160-4.5 MCG/ACT IN AERO: 2.0000 | INHALATION_SPRAY | Freq: Two times a day (BID) | RESPIRATORY_TRACT | Status: DC

## 2013-12-01 NOTE — Telephone Encounter (Signed)
Patient is aware that she has refills on her Dulera. CLS She states that she will call the pharmacy. CLS

## 2013-12-01 NOTE — Telephone Encounter (Signed)
Patient was using samples of Dulera that you gave her at her OV. CLS  Patient is aware of the new inhaler that was sent. CLS

## 2013-12-01 NOTE — Telephone Encounter (Signed)
Pt states that dulera is $266 before the coupon and after coupon it is still $180.00. Pt state she can not afford that. Pt uses [facility]

## 2013-12-01 NOTE — Telephone Encounter (Signed)
She already should have refills on Dulera, as I provided refills on the last script.  How is she doing on the [facility]?

## 2013-12-01 NOTE — Telephone Encounter (Signed)
I sent Symbicort instead.  Let me know if too expensive.   So was she using Dulera recently or what is the situation?

## 2013-12-01 NOTE — Telephone Encounter (Signed)
Wants Rx [employer] sent to [employer]

## 2013-12-03 ENCOUNTER — Telehealth: Payer: Self-pay | Admitting: Medical

## 2013-12-03 NOTE — Telephone Encounter (Signed)
Please call proventil not covered by insurance  If she needs to have a rescue, then she needs Rx for covered drug   Claudia Hendrix is covered  [place]

## 2013-12-03 NOTE — Telephone Encounter (Signed)
I feel like we are going around and around.  I had a message yesterday that Ruthe Mannan was not covered and that is why I switch to Symbicort.   I'm going to leave it up to you guys to call the pharmacy to verify what is covered and get it refilled.   Refill either Dulera or Symbicort for prevention, and refill whatever albuterol rescue inhaler is covered.  Followup in one month

## 2013-12-04 NOTE — Telephone Encounter (Signed)
Spoke with patient, she has all the refills on her meds that she needs, she went to pharmacy today and picked up what she needed. Is there any more need to follow up?

## 2013-12-04 NOTE — Telephone Encounter (Signed)
Next f/u should be a fasting physical at her convenience.   Can make this appt at her convenience, preferably in the next few months.

## 2013-12-11 ENCOUNTER — Other Ambulatory Visit (INDEPENDENT_AMBULATORY_CARE_PROVIDER_SITE_OTHER): Payer: BC Managed Care – PPO

## 2013-12-11 DIAGNOSIS — Z23 Encounter for immunization: Secondary | ICD-10-CM

## 2013-12-11 NOTE — Telephone Encounter (Signed)
Advised patient per VM to call back and schedule fasting physical.

## 2014-02-02 ENCOUNTER — Encounter: Payer: Self-pay | Admitting: Medical

## 2014-10-26 ENCOUNTER — Other Ambulatory Visit: Payer: Self-pay | Admitting: Obstetrics and Gynecology

## 2014-10-27 LAB — CYTOLOGY - PAP

## 2014-12-06 NOTE — H&P (Signed)
NAME:  Claudia Hendrix, Claudia Hendrix NO.:  [id number]  MEDICAL RECORD NO.:  [id number]  LOCATION:  [facility]                         FACILITY:  [facility]  PHYSICIAN:  Darlyn Chamber, M.D.   DATE OF BIRTH:  [birth year]  DATE OF ADMISSION:  12/11/2014 DATE OF DISCHARGE:                             HISTORY & PHYSICAL   DATE OF SURGERY:  December 11, 2014, at [facility] here in [place].  HISTORY OF PRESENT ILLNESS:  The patient is a 39 year old, gravida 2, para 2 female, who presents for laparoscopic evaluation with a left tubal ligation and chromohydrotubation.  In relation to the present admission, the patient did have an Essure placed on the right side, but we could not get the left side placed. She now wants to proceed with permanent sterilization.  She does have a history of uterine fibroids with heavy menstrual flow.  Does not want any more aggressive with the fibroids at this point in time, so we are going to do the tubal in the left and then confirm that the right is blocked with chromohydrotubation.  ALLERGIES:  The patient has no known drug allergies.  MEDICATIONS:  None.  PAST MEDICAL HISTORY:  Does have a history of arthritis and asthma.  PAST SURGICAL HISTORY:  She has had 1 vaginal delivery, 1 cesarean section.  She had surgery on her wrist and forearm, and she had the above-noted hysteroscopy with passing of an Essure only the right side.  SOCIAL HISTORY:  No tobacco or alcohol use.  FAMILY HISTORY:  Noncontributory.  PHYSICAL EXAMINATION:  VITAL SIGNS:  Patient is afebrile.  Stable vital signs. HEENT:  The patient is normocephalic.  Pupils equal, round, reactive to light and accommodation.  Extraocular movements were intact.  Sclerae and conjunctivae are clear.  Oropharynx clear. NECK:  Without thyromegaly. BREASTS:  Not examined. LUNGS:  Clear. CARDIOVASCULAR SYSTEM:  Regular rhythm and rate without murmurs or gallops. ABDOMEN:  Benign.  No mass,  organomegaly, or tenderness. PELVIC:  Normal external genitalia.  Vaginal mucosa is clear.  Cervix unremarkable.  Uterus 9 weeks in size consistent with known fibroids. Adnexa unremarkable. EXTREMITIES:  Trace edema. NEUROLOGIC:  Gross normal limits.  IMPRESSION: 1. Multiparity desires sterility. 2. Previous Essure on the right fallopian tube. 3. Uterine fibroids.  PLAN:  The patient will undergo diagnostic laparoscopy with left tubal ligation using cautery.  We will document blockage on the right side with chromohydrotubation.  The nature of surgery have been discussed. The risks have been explained including the risk of infection.  The risk of hemorrhage that could require transfusion with the risk of AIDS or hepatitis.  Risk of injury to adjacent organs such as bladder, bowel, ureters that could require further exploratory surgery.  Risk of deep venous thrombosis and pulmonary embolus.  We discussed the potential irreversibility of sterilization.  Failure rate of 1 in 200 is quoted. Failures can be in the form of ectopic pregnancy, requiring further surgical management.  Other alternatives were discussed.     Darlyn Chamber, M.D.     JSM/MEDQ  D:  12/06/2014  T:  12/06/2014  Job:  [id number]

## 2014-12-06 NOTE — H&P (Signed)
  Patient name  Claudia Hendrix, Claudia Hendrix DICTATION#  [id number] CSN# [id number]  Darlyn Chamber, MD 12/06/2014 5:41 PM

## 2014-12-10 NOTE — Anesthesia Preprocedure Evaluation (Addendum)
Anesthesia Evaluation  Patient identified by MRN, date of birth, ID band Patient awake    Reviewed: Allergy & Precautions, H&P , Patient's Chart, lab work & pertinent test results, reviewed documented beta blocker date and time   Airway Mallampati: II  TM Distance: >3 FB Neck ROM: full    Dental no notable dental hx.    Pulmonary asthma ,    breath sounds clear to auscultation+ rhonchi  + wheezing      Cardiovascular  Rhythm:regular Rate:Normal     Neuro/Psych    GI/Hepatic   Endo/Other    Renal/GU      Musculoskeletal   Abdominal   Peds  Hematology   Anesthesia Other Findings Recent URI: rhonchi and wheezing heard. Pt prefers to cancel and reschedule  Reproductive/Obstetrics                           Anesthesia Physical Anesthesia Plan  ASA: II  Anesthesia Plan: General   Post-op Pain Management:    Induction: Intravenous  Airway Management Planned: Oral ETT  Additional Equipment:   Intra-op Plan:   Post-operative Plan: Extubation in OR  Informed Consent: I have reviewed the patients History and Physical, chart, labs and discussed the procedure including the risks, benefits and alternatives for the proposed anesthesia with the patient or authorized representative who has indicated his/her understanding and acceptance.   Dental Advisory Given and Dental advisory given  Plan Discussed with: CRNA and Surgeon  Anesthesia Plan Comments: (  Discussed general anesthesia, including possible nausea, instrumentation of airway, sore throat,pulmonary aspiration, etc. I asked if the were any outstanding questions, or  concerns before we proceeded. )        Anesthesia Quick Evaluation

## 2014-12-11 ENCOUNTER — Encounter (HOSPITAL_COMMUNITY)
Admission: RE | Disposition: A | Discharge status: Home / Self Care | Payer: Self-pay | Source: Ambulatory Visit | Attending: Obstetrics and Gynecology

## 2014-12-11 ENCOUNTER — Ambulatory Visit (HOSPITAL_COMMUNITY): Payer: 59 | Admitting: Anesthesiology

## 2014-12-11 ENCOUNTER — Encounter (HOSPITAL_COMMUNITY): Payer: Self-pay | Admitting: Emergency Medicine

## 2014-12-11 ENCOUNTER — Ambulatory Visit (HOSPITAL_COMMUNITY)
Admission: RE | Admit: 2014-12-11 | Discharge: 2014-12-11 | Disposition: A | Discharge status: Home / Self Care | Payer: 59 | Source: Ambulatory Visit | Attending: Obstetrics and Gynecology | Admitting: Obstetrics and Gynecology

## 2014-12-11 DIAGNOSIS — D259 Leiomyoma of uterus, unspecified: Secondary | ICD-10-CM | POA: Diagnosis not present

## 2014-12-11 DIAGNOSIS — J45909 Unspecified asthma, uncomplicated: Secondary | ICD-10-CM | POA: Diagnosis not present

## 2014-12-11 DIAGNOSIS — M1990 Unspecified osteoarthritis, unspecified site: Secondary | ICD-10-CM | POA: Diagnosis not present

## 2014-12-11 DIAGNOSIS — Z5309 Procedure and treatment not carried out because of other contraindication: Secondary | ICD-10-CM | POA: Insufficient documentation

## 2014-12-11 DIAGNOSIS — Z302 Encounter for sterilization: Secondary | ICD-10-CM | POA: Insufficient documentation

## 2014-12-11 DIAGNOSIS — R05 Cough: Secondary | ICD-10-CM | POA: Insufficient documentation

## 2014-12-11 HISTORY — PX: LAPAROSCOPIC TUBAL LIGATION: SHX1937

## 2014-12-11 LAB — CBC
HCT: 37.5 % (ref 36.0–46.0)
Hemoglobin: 12.7 g/dL (ref 12.0–15.0)
MCH: 30.2 pg (ref 26.0–34.0)
MCHC: 33.9 g/dL (ref 30.0–36.0)
MCV: 89.3 fL (ref 78.0–100.0)
PLATELETS: 263 10*3/uL (ref 150–400)
RBC: 4.2 MIL/uL (ref 3.87–5.11)
RDW: 12.6 % (ref 11.5–15.5)
WBC: 8.1 10*3/uL (ref 4.0–10.5)

## 2014-12-11 LAB — HCG, SERUM, QUALITATIVE: PREG SERUM: NEGATIVE

## 2014-12-11 SURGERY — LIGATION, FALLOPIAN TUBE, LAPAROSCOPIC
Anesthesia: General | Laterality: Left

## 2014-12-11 MED ORDER — SCOPOLAMINE 1 MG/3DAYS TD PT72
MEDICATED_PATCH | TRANSDERMAL | Status: AC
  Administered 2014-12-11: 1.5 mg via TRANSDERMAL
  Filled 2014-12-11: qty 1

## 2014-12-11 MED ORDER — ACETAMINOPHEN 160 MG/5ML PO SOLN
975.0000 mg | Freq: Once | ORAL | Status: AC
  Administered 2014-12-11: 975 mg via ORAL

## 2014-12-11 MED ORDER — LACTATED RINGERS IV SOLN
INTRAVENOUS | Status: DC
  Administered 2014-12-11: 07:00:00 via INTRAVENOUS

## 2014-12-11 MED ORDER — ONDANSETRON HCL 4 MG/2ML IJ SOLN
INTRAMUSCULAR | Status: AC
  Filled 2014-12-11: qty 2

## 2014-12-11 MED ORDER — ACETAMINOPHEN 160 MG/5ML PO SOLN
ORAL | Status: AC
  Administered 2014-12-11: 975 mg via ORAL
  Filled 2014-12-11: qty 40.6

## 2014-12-11 MED ORDER — FENTANYL CITRATE (PF) 250 MCG/5ML IJ SOLN
INTRAMUSCULAR | Status: AC
  Filled 2014-12-11: qty 25

## 2014-12-11 MED ORDER — SCOPOLAMINE 1 MG/3DAYS TD PT72
1.0000 | MEDICATED_PATCH | Freq: Once | TRANSDERMAL | Status: DC
  Administered 2014-12-11: 1.5 mg via TRANSDERMAL

## 2014-12-11 MED ORDER — PROPOFOL 10 MG/ML IV BOLUS
INTRAVENOUS | Status: AC
  Filled 2014-12-11: qty 20

## 2014-12-11 MED ORDER — MIDAZOLAM HCL 2 MG/2ML IJ SOLN
INTRAMUSCULAR | Status: AC
  Filled 2014-12-11: qty 4

## 2014-12-11 MED ORDER — LIDOCAINE HCL (CARDIAC) 20 MG/ML IV SOLN
INTRAVENOUS | Status: AC
  Filled 2014-12-11: qty 5

## 2014-12-11 SURGICAL SUPPLY — 17 items
CATH ROBINSON RED A/P 16FR (CATHETERS) ×2 IMPLANT
CLOTH BEACON ORANGE TIMEOUT ST (SAFETY) ×2 IMPLANT
DRSG COVADERM PLUS 2X2 (GAUZE/BANDAGES/DRESSINGS) ×4 IMPLANT
DRSG OPSITE POSTOP 3X4 (GAUZE/BANDAGES/DRESSINGS) IMPLANT
GLOVE BIO SURGEON STRL SZ7 (GLOVE) ×2 IMPLANT
GOWN STRL REUS W/TWL LRG LVL3 (GOWN DISPOSABLE) ×4 IMPLANT
NEEDLE INSUFFLATION 120MM (ENDOMECHANICALS) IMPLANT
PACK LAPAROSCOPY BASIN (CUSTOM PROCEDURE TRAY) ×2 IMPLANT
PAD POSITIONING PINK XL (MISCELLANEOUS) ×2 IMPLANT
STRIP CLOSURE SKIN 1/4X3 (GAUZE/BANDAGES/DRESSINGS) ×2 IMPLANT
SUT VIC AB 3-0 FS2 27 (SUTURE) ×2 IMPLANT
SUT VICRYL 0 UR6 27IN ABS (SUTURE) IMPLANT
TOWEL OR 17X24 6PK STRL BLUE (TOWEL DISPOSABLE) ×4 IMPLANT
TROCAR BALLN 12MMX100 BLUNT (TROCAR) IMPLANT
TROCAR XCEL DIL TIP R 11M (ENDOMECHANICALS) IMPLANT
WARMER LAPAROSCOPE (MISCELLANEOUS) ×2 IMPLANT
WATER STERILE IRR 1000ML POUR (IV SOLUTION) ×2 IMPLANT

## 2014-12-11 NOTE — OR Nursing (Signed)
Pt has had a cough for approx. 1 week and not feeling well. After discussion with Dr. Glennon Mac case will be canceled.

## 2014-12-14 ENCOUNTER — Encounter (HOSPITAL_COMMUNITY): Payer: Self-pay | Admitting: Obstetrics and Gynecology

## 2015-01-07 NOTE — H&P (Signed)
  Patient name  Claudia Hendrix, [name] [name]  Darlyn Chamber, MD 01/07/2015 10:10 AM

## 2015-01-08 NOTE — H&P (Signed)
NAME:  Claudia Hendrix, Claudia Hendrix NO.:  [id number]  MEDICAL RECORD NO.:  [id number]  LOCATION:                                FACILITY:  [facility]  PHYSICIAN:  Darlyn Chamber, M.D.   DATE OF BIRTH:  1975-04-14  DATE OF ADMISSION:  01/22/2015 DATE OF DISCHARGE:                             HISTORY & PHYSICAL   HISTORY OF PRESENT ILLNESS:  The patient is a 39 year old, gravida 2, para 2 female, who presents for laparoscopic left tubal ligation.  In relation to the present admission, the patient did have an Essure.  We were able to place the coil on the right side, but could not get in on the left side.  She wants to proceed with permanent sterilization.  Does have a history of uterine fibroids.  She does have heavy bleeding, does not want more aggressive therapy so we are going to proceed with tubal ligation on the left side.  We were going to do chromohydrotubation, but because of the cost, she has declined that.  ALLERGIES:  The patient has no known drug allergies.  MEDICATIONS:  None.  PAST MEDICAL HISTORY:  History of arthritis and asthma.  PAST SURGICAL HISTORY:  She has had one vaginal delivery.  One cesarean section.  She has had wrist and forearm surgery.  Also did have hysteroscopy with passing of an Essure only on the right.  SOCIAL HISTORY:  No tobacco or alcohol use.  FAMILY HISTORY:  Noncontributory.  REVIEW OF SYSTEMS:  Noncontributory.  PHYSICAL EXAMINATION:  GENERAL:  The patient is afebrile.  VITAL SIGNS: Stable vital signs.  HEENT EXAM:  The patient normocephalic.  Pupils equal, round, reactive to light and accommodation.  Extraocular movements were intact.  Sclerae and conjunctivae are clear.  Oropharynx clear.  NECK:  Without thyromegaly.  BREASTS:  Not examined.  LUNGS: Clear.  CARDIOVASCULAR SYSTEM:  Regular rhythm rate without murmurs or gallops.  ABDOMEN:  Benign.  No mass, organomegaly or tenderness. PELVIC:  Normal external genitalia.  Vaginal  mucosa is clear.  Cervix unremarkable.  Uterus 9 weeks in size, consistent with known fibroids. Adnexa unremarkable.  EXTREMITIES:  Trace edema.  NEUROLOGIC:  Gross normal limits.  IMPRESSION: 1. Multiparity, desires sterility. 2. Previous Essure only in the right fallopian tube. 3. Uterine fibroids.  PLAN:  The patient will undergo diagnostic laparoscopy with left tubal ligation.  Will be using cautery.  The risks of surgery have been discussed including the risk of infection.  Risk of hemorrhage that could require transfusion with the risk of AIDS or hepatitis.  Excessive bleeding could require exploratory surgery for management.  Risk of injury to adjacent organs including bowel and bladder that could require further exploratory surgery.  Risk of deep venous thrombosis and pulmonary embolus.  The patient understands the potential irreversibility of sterilization.  Failure rate of 1 in 200 is quoted. Failures can be in the form of ectopic pregnancy, requiring further surgical management.  We have also discussed that since we cannot afford the chromohydrotubation, we cannot document complete closure of the right fallopian tube.  She does understand this and does not wish to proceed with it.     Jenny Reichmann  Sherran Needs, M.D.     JSM/MEDQ  D:  01/07/2015  T:  01/07/2015  Job:  [id number]

## 2015-01-18 ENCOUNTER — Other Ambulatory Visit (HOSPITAL_COMMUNITY): Payer: 59

## 2015-01-20 NOTE — Patient Instructions (Signed)
Your procedure is scheduled on:  Friday, January 22, 2015  Enter through the Main Entrance of [facility] at:  6:00 a.m.  Pick up the phone at the desk and dial [phone].  Call this number if you have problems the morning of surgery: [phone].  Remember: Do NOT eat food or drink after: MIDNIGHT TONIGHT Take these medicines the morning of surgery with a SIP OF WATER:  NONE  *BRING ASTHMA INHALER DAY OF SURGERY  Do NOT wear jewelry (body piercing), metal hair clips/bobby pins, make-up, or nail polish. Do NOT wear lotions, powders, or perfumes.  You may wear deoderant. Do NOT shave for 48 hours prior to surgery. Do NOT bring valuables to the hospital. Contacts, dentures, or bridgework may not be worn into surgery.  Have a responsible adult drive you home and stay with you for 24 hours after your procedure

## 2015-01-21 ENCOUNTER — Encounter (HOSPITAL_COMMUNITY)
Admission: RE | Admit: 2015-01-21 | Discharge: 2015-01-21 | Disposition: A | Discharge status: Home / Self Care | Payer: 59 | Source: Ambulatory Visit | Attending: Obstetrics and Gynecology | Admitting: Obstetrics and Gynecology

## 2015-01-21 ENCOUNTER — Encounter (HOSPITAL_COMMUNITY): Payer: Self-pay

## 2015-01-21 ENCOUNTER — Encounter (HOSPITAL_COMMUNITY): Payer: Self-pay | Admitting: Anesthesiology

## 2015-01-21 DIAGNOSIS — D259 Leiomyoma of uterus, unspecified: Secondary | ICD-10-CM | POA: Diagnosis not present

## 2015-01-21 DIAGNOSIS — Z302 Encounter for sterilization: Secondary | ICD-10-CM | POA: Diagnosis present

## 2015-01-21 LAB — CBC
HCT: 36.2 % (ref 36.0–46.0)
HEMOGLOBIN: 12.5 g/dL (ref 12.0–15.0)
MCH: 30.6 pg (ref 26.0–34.0)
MCHC: 34.5 g/dL (ref 30.0–36.0)
MCV: 88.7 fL (ref 78.0–100.0)
PLATELETS: 226 10*3/uL (ref 150–400)
RBC: 4.08 MIL/uL (ref 3.87–5.11)
RDW: 12.5 % (ref 11.5–15.5)
WBC: 10.4 10*3/uL (ref 4.0–10.5)

## 2015-01-21 NOTE — Anesthesia Preprocedure Evaluation (Addendum)
Anesthesia Evaluation  Patient identified by MRN, date of birth, ID band Patient awake    Reviewed: Allergy & Precautions, NPO status , Patient's Chart, lab work & pertinent test results  Airway Mallampati: II  TM Distance: >3 FB Neck ROM: Full    Dental no notable dental hx. (+) Teeth Intact   Pulmonary asthma ,    Pulmonary exam normal breath sounds clear to auscultation       Cardiovascular negative cardio ROS Normal cardiovascular exam Rhythm:Regular Rate:Normal     Neuro/Psych negative neurological ROS  negative psych ROS   GI/Hepatic negative GI ROS, Neg liver ROS,   Endo/Other  Obesity   Renal/GU negative Renal ROS  negative genitourinary   Musculoskeletal   Abdominal   Peds  Hematology negative hematology ROS (+)   Anesthesia Other Findings   Reproductive/Obstetrics Desires sterilization                             Anesthesia Physical Anesthesia Plan  ASA: II  Anesthesia Plan: General   Post-op Pain Management:    Induction: Intravenous  Airway Management Planned: Oral ETT  Additional Equipment:   Intra-op Plan:   Post-operative Plan: Extubation in OR  Informed Consent: I have reviewed the patients History and Physical, chart, labs and discussed the procedure including the risks, benefits and alternatives for the proposed anesthesia with the patient or authorized representative who has indicated his/her understanding and acceptance.   Dental advisory given  Plan Discussed with: CRNA, Surgeon and Anesthesiologist  Anesthesia Plan Comments:         Anesthesia Quick Evaluation

## 2015-01-22 ENCOUNTER — Ambulatory Visit (HOSPITAL_COMMUNITY): Payer: 59 | Admitting: Anesthesiology

## 2015-01-22 ENCOUNTER — Ambulatory Visit (HOSPITAL_COMMUNITY)
Admission: RE | Admit: 2015-01-22 | Discharge: 2015-01-22 | Disposition: A | Discharge status: Home / Self Care | Payer: 59 | Source: Ambulatory Visit | Attending: Obstetrics and Gynecology | Admitting: Obstetrics and Gynecology

## 2015-01-22 ENCOUNTER — Encounter (HOSPITAL_COMMUNITY)
Admission: RE | Disposition: A | Discharge status: Home / Self Care | Payer: Self-pay | Source: Ambulatory Visit | Attending: Obstetrics and Gynecology

## 2015-01-22 DIAGNOSIS — Z302 Encounter for sterilization: Secondary | ICD-10-CM | POA: Diagnosis not present

## 2015-01-22 DIAGNOSIS — D259 Leiomyoma of uterus, unspecified: Secondary | ICD-10-CM | POA: Insufficient documentation

## 2015-01-22 DIAGNOSIS — Z9851 Tubal ligation status: Secondary | ICD-10-CM

## 2015-01-22 HISTORY — PX: LAPAROSCOPIC TUBAL LIGATION: SHX1937

## 2015-01-22 LAB — HCG, SERUM, QUALITATIVE: Preg, Serum: NEGATIVE

## 2015-01-22 SURGERY — LIGATION, FALLOPIAN TUBE, LAPAROSCOPIC
Anesthesia: General | Laterality: Bilateral

## 2015-01-22 MED ORDER — MIDAZOLAM HCL 2 MG/2ML IJ SOLN
INTRAMUSCULAR | Status: AC
  Filled 2015-01-22: qty 4

## 2015-01-22 MED ORDER — SCOPOLAMINE 1 MG/3DAYS TD PT72
MEDICATED_PATCH | TRANSDERMAL | Status: AC
  Filled 2015-01-22: qty 1

## 2015-01-22 MED ORDER — LACTATED RINGERS IV SOLN
INTRAVENOUS | Status: DC
  Administered 2015-01-22: 08:00:00 via INTRAVENOUS
  Administered 2015-01-22: 125 mL/h via INTRAVENOUS

## 2015-01-22 MED ORDER — HYDROCODONE-ACETAMINOPHEN 7.5-325 MG PO TABS: 1.0000 | ORAL_TABLET | Freq: Once | ORAL | Status: DC | PRN

## 2015-01-22 MED ORDER — PROPOFOL 10 MG/ML IV BOLUS
INTRAVENOUS | Status: AC
  Filled 2015-01-22: qty 20

## 2015-01-22 MED ORDER — FENTANYL CITRATE (PF) 100 MCG/2ML IJ SOLN: 25.0000 ug | INTRAMUSCULAR | Status: DC | PRN

## 2015-01-22 MED ORDER — BUPIVACAINE HCL (PF) 0.25 % IJ SOLN
INTRAMUSCULAR | Status: DC | PRN
  Administered 2015-01-22: 5 mL

## 2015-01-22 MED ORDER — DEXAMETHASONE SODIUM PHOSPHATE 10 MG/ML IJ SOLN
INTRAMUSCULAR | Status: DC | PRN
  Administered 2015-01-22: 4 mg via INTRAVENOUS

## 2015-01-22 MED ORDER — LIDOCAINE HCL (CARDIAC) 20 MG/ML IV SOLN
INTRAVENOUS | Status: DC | PRN
  Administered 2015-01-22: 100 mg via INTRAVENOUS

## 2015-01-22 MED ORDER — ESMOLOL HCL 100 MG/10ML IV SOLN
INTRAVENOUS | Status: AC
  Filled 2015-01-22: qty 10

## 2015-01-22 MED ORDER — FENTANYL CITRATE (PF) 250 MCG/5ML IJ SOLN
INTRAMUSCULAR | Status: AC
  Filled 2015-01-22: qty 25

## 2015-01-22 MED ORDER — DEXAMETHASONE SODIUM PHOSPHATE 4 MG/ML IJ SOLN
INTRAMUSCULAR | Status: AC
  Filled 2015-01-22: qty 1

## 2015-01-22 MED ORDER — FENTANYL CITRATE (PF) 100 MCG/2ML IJ SOLN
INTRAMUSCULAR | Status: DC | PRN
  Administered 2015-01-22: 50 ug via INTRAVENOUS
  Administered 2015-01-22: 100 ug via INTRAVENOUS

## 2015-01-22 MED ORDER — ONDANSETRON HCL 4 MG/2ML IJ SOLN
INTRAMUSCULAR | Status: AC
  Filled 2015-01-22: qty 2

## 2015-01-22 MED ORDER — GLYCOPYRROLATE 0.2 MG/ML IJ SOLN
INTRAMUSCULAR | Status: AC
  Filled 2015-01-22: qty 3

## 2015-01-22 MED ORDER — GLYCOPYRROLATE 0.2 MG/ML IJ SOLN
INTRAMUSCULAR | Status: DC | PRN
  Administered 2015-01-22: 0.4 mg via INTRAVENOUS

## 2015-01-22 MED ORDER — CEFAZOLIN SODIUM-DEXTROSE 2-3 GM-% IV SOLR
INTRAVENOUS | Status: AC
  Filled 2015-01-22: qty 50

## 2015-01-22 MED ORDER — ONDANSETRON HCL 4 MG/2ML IJ SOLN
INTRAMUSCULAR | Status: DC | PRN
  Administered 2015-01-22: 4 mg via INTRAVENOUS

## 2015-01-22 MED ORDER — NEOSTIGMINE METHYLSULFATE 10 MG/10ML IV SOLN
INTRAVENOUS | Status: AC
  Filled 2015-01-22: qty 1

## 2015-01-22 MED ORDER — MIDAZOLAM HCL 2 MG/2ML IJ SOLN
INTRAMUSCULAR | Status: DC | PRN
  Administered 2015-01-22: 2 mg via INTRAVENOUS

## 2015-01-22 MED ORDER — ROCURONIUM BROMIDE 100 MG/10ML IV SOLN
INTRAVENOUS | Status: DC | PRN
  Administered 2015-01-22: 30 mg via INTRAVENOUS

## 2015-01-22 MED ORDER — SCOPOLAMINE 1 MG/3DAYS TD PT72
1.0000 | MEDICATED_PATCH | Freq: Once | TRANSDERMAL | Status: DC
  Administered 2015-01-22: 1.5 mg via TRANSDERMAL

## 2015-01-22 MED ORDER — LIDOCAINE HCL (CARDIAC) 20 MG/ML IV SOLN
INTRAVENOUS | Status: AC
  Filled 2015-01-22: qty 5

## 2015-01-22 MED ORDER — ESMOLOL HCL 100 MG/10ML IV SOLN
INTRAVENOUS | Status: DC | PRN
  Administered 2015-01-22: 10 mg via INTRAVENOUS

## 2015-01-22 MED ORDER — MEPERIDINE HCL 25 MG/ML IJ SOLN: 6.2500 mg | INTRAMUSCULAR | Status: DC | PRN

## 2015-01-22 MED ORDER — CEFAZOLIN SODIUM-DEXTROSE 2-3 GM-% IV SOLR
2.0000 g | INTRAVENOUS | Status: AC
  Administered 2015-01-22: 2 g via INTRAVENOUS

## 2015-01-22 MED ORDER — ROCURONIUM BROMIDE 100 MG/10ML IV SOLN
INTRAVENOUS | Status: AC
  Filled 2015-01-22: qty 1

## 2015-01-22 MED ORDER — KETOROLAC TROMETHAMINE 30 MG/ML IJ SOLN
INTRAMUSCULAR | Status: AC
  Filled 2015-01-22: qty 1

## 2015-01-22 MED ORDER — BUPIVACAINE HCL (PF) 0.25 % IJ SOLN
INTRAMUSCULAR | Status: AC
  Filled 2015-01-22: qty 30

## 2015-01-22 MED ORDER — METOCLOPRAMIDE HCL 5 MG/ML IJ SOLN: 10.0000 mg | Freq: Once | INTRAMUSCULAR | Status: DC | PRN

## 2015-01-22 MED ORDER — NEOSTIGMINE METHYLSULFATE 10 MG/10ML IV SOLN
INTRAVENOUS | Status: DC | PRN
  Administered 2015-01-22: 3 mg via INTRAVENOUS

## 2015-01-22 MED ORDER — PROPOFOL 10 MG/ML IV BOLUS
INTRAVENOUS | Status: DC | PRN
  Administered 2015-01-22: 160 mg via INTRAVENOUS

## 2015-01-22 SURGICAL SUPPLY — 21 items
ADH SKN CLS LQ APL DERMABOND (GAUZE/BANDAGES/DRESSINGS) ×1
CATH ROBINSON RED A/P 16FR (CATHETERS) ×3 IMPLANT
CLOSURE WOUND 1/4 X3 (GAUZE/BANDAGES/DRESSINGS) ×1
CLOTH BEACON ORANGE TIMEOUT ST (SAFETY) ×3 IMPLANT
DERMABOND ADHESIVE PROPEN (GAUZE/BANDAGES/DRESSINGS) ×2
DERMABOND ADVANCED .7 DNX6 (GAUZE/BANDAGES/DRESSINGS) IMPLANT
DRSG COVADERM PLUS 2X2 (GAUZE/BANDAGES/DRESSINGS) ×6 IMPLANT
DRSG OPSITE POSTOP 3X4 (GAUZE/BANDAGES/DRESSINGS) ×4 IMPLANT
GLOVE BIO SURGEON STRL SZ7 (GLOVE) ×3 IMPLANT
GOWN STRL REUS W/TWL LRG LVL3 (GOWN DISPOSABLE) ×6 IMPLANT
NEEDLE INSUFFLATION 120MM (ENDOMECHANICALS) ×2 IMPLANT
PACK LAPAROSCOPY BASIN (CUSTOM PROCEDURE TRAY) ×3 IMPLANT
PAD POSITIONING PINK XL (MISCELLANEOUS) ×3 IMPLANT
STRIP CLOSURE SKIN 1/4X3 (GAUZE/BANDAGES/DRESSINGS) ×2 IMPLANT
SUT VIC AB 3-0 FS2 27 (SUTURE) ×3 IMPLANT
SUT VICRYL 0 UR6 27IN ABS (SUTURE) ×2 IMPLANT
TOWEL OR 17X24 6PK STRL BLUE (TOWEL DISPOSABLE) ×6 IMPLANT
TROCAR BALLN 12MMX100 BLUNT (TROCAR) IMPLANT
TROCAR XCEL DIL TIP R 11M (ENDOMECHANICALS) IMPLANT
WARMER LAPAROSCOPE (MISCELLANEOUS) ×3 IMPLANT
WATER STERILE IRR 1000ML POUR (IV SOLUTION) ×3 IMPLANT

## 2015-01-22 NOTE — Transfer of Care (Signed)
Immediate Anesthesia Transfer of Care Note  Patient: Claudia Hendrix  Procedure(s) Performed: Procedure(s): LAPAROSCOPIC TUBAL LIGATION (Bilateral)  Patient Location: PACU  Anesthesia Type:General  Level of Consciousness: awake, alert  and oriented  Airway & Oxygen Therapy: Patient Spontanous Breathing and Patient connected to nasal cannula oxygen  Post-op Assessment: Report given to RN  Post vital signs: Reviewed  Last Vitals:  Filed Vitals:   01/22/15 0607  BP: 128/66  Temp: 36.9 C  Resp: 18    Complications: No apparent anesthesia complications

## 2015-01-22 NOTE — Discharge Instructions (Signed)

## 2015-01-22 NOTE — Brief Op Note (Signed)
01/22/2015  8:21 AM  PATIENT:  Claudia Hendrix  39 y.o. female  PRE-OPERATIVE DIAGNOSIS:  desires sterilization  POST-OPERATIVE DIAGNOSIS:  desires sterilization  PROCEDURE:  Procedure(s): LAPAROSCOPIC TUBAL LIGATION (Bilateral)  SURGEON:  Surgeon(s) and Role:    * Arvella Nigh, MD - Primary  PHYSICIAN ASSISTANT:   ASSISTANTS: none   ANESTHESIA: general  EBL:  Total I/O In: 800 [I.V.:800] Out: -   BLOOD ADMINISTERED:none  DRAINS: none   LOCAL MEDICATIONS USED:  MARCAINE     SPECIMEN:  No Specimen  DISPOSITION OF SPECIMEN:  N/A  COUNTS:  YES  TOURNIQUET:  * No tourniquets in log *  DICTATION: .Other Dictation: Dictation Number [id number]  PLAN OF CARE: Discharge to home after PACU  PATIENT DISPOSITION:  PACU - hemodynamically stable.   Delay start of Pharmacological VTE agent (>24hrs) due to surgical blood loss or risk of bleeding: not applicable

## 2015-01-22 NOTE — H&P (Signed)
  History and physical exam unchanged 

## 2015-01-22 NOTE — Op Note (Signed)
NAME:  Claudia Hendrix, Claudia Hendrix NO.:  [id number]  MEDICAL RECORD NO.:  [id number]  LOCATION:  [facility]                          FACILITY:  [facility]  PHYSICIAN:  Darlyn Chamber, M.D.   DATE OF BIRTH:  10/16/1975  DATE OF PROCEDURE:  01/22/2015 DATE OF DISCHARGE:                              OPERATIVE REPORT   PREOPERATIVE DIAGNOSIS:  Multiparity, desires sterility.  POSTOPERATIVE DIAGNOSIS:  Multiparity, desires sterility.  OPERATIVE PROCEDURE:  Laparoscopic left tubal ligation.  SURGEON:  Darlyn Chamber, M.D.  ANESTHESIA:  General endotracheal.  ESTIMATED BLOOD LOSS:  Minimal.  PACKS AND DRAINS:  None.  INTRAOPERATIVE BLOOD PLACED:  None.  COMPLICATIONS:  None.  INDICATION:  Dictated in history and physical.  PROCEDURE IN DETAIL:  The patient was taken to the OR and placed in supine position.  After satisfactory level of general endotracheal anesthesia was obtained, the patient was placed in dorsal lithotomy position using Allen stirrups.  The abdomen, perineum, and vagina were prepped out with Betadine.  Bladder was emptied by in-and-out catheterization.  A speculum was placed in the vaginal vault.  Grasped with single-tooth tenaculum.  Kahn cannula was put in place.  The patient then draped in sterile field.  Subumbilical incision was made with a knife.  Veress needle was introduced into the abdominal cavity. Abdomen was inflated with approximately 3 L of carbon dioxide.  A 10/11 trocar was inserted without difficulty.  Laparoscope was introduced. There was no evidence of injury to adjacent organs.  A 5-mm trocar was put in place in suprapubic area under direct visualization.  Appendix at this time had some scarring around it but was otherwise unremarkable. Upper abdomen including the liver and tip of the gallbladder were clear. Uterus, tubes, and ovaries were unremarkable.  Right tube had had a previous Essure which was visible in the tube.  At this point in  time, we went to the left tube using the Kleppinger, a 3-cm tube was completely coagulated until resistance read 0.  We then re-coagulated the same segment of tube completely desiccating the tube, approximately 3-cm segment was coagulated.  Tube was adequately coagulated.  At this point in time, the abdomen was desufflated with carbon dioxide.  All trocars were removed.  Subumbilical incision closed with interrupted subcuticular 4-0 Vicryl.  Suprapubic incision closed with Dermabond. The single-tooth tenaculum and Kahn cannula were then removed.  The patient taken out of the dorsal lithotomy position, once alert, extubated and transferred to the recovery room in good condition.  Sponge, instrument, and needle counts were correct by the circulating nurse x2.     Darlyn Chamber, M.D.     JSM/MEDQ  D:  01/22/2015  T:  01/22/2015  Job:  [id number]

## 2015-01-22 NOTE — Anesthesia Postprocedure Evaluation (Signed)
  Anesthesia Post-op Note  Patient: Claudia Hendrix  Procedure(s) Performed: Procedure(s): LAPAROSCOPIC TUBAL LIGATION (Bilateral)  Patient Location: PACU  Anesthesia Type:General  Level of Consciousness: awake, alert  and oriented  Airway and Oxygen Therapy: Patient Spontanous Breathing  Post-op Pain: mild  Post-op Assessment: Post-op Vital signs reviewed, Patient's Cardiovascular Status Stable, Respiratory Function Stable, Patent Airway, No signs of Nausea or vomiting and Pain level controlled              Post-op Vital Signs: Reviewed and stable  Last Vitals:  Filed Vitals:   01/22/15 0930  BP:   Pulse:   Temp: 36.6 C  Resp:     Complications: No apparent anesthesia complications

## 2015-01-22 NOTE — Anesthesia Procedure Notes (Signed)
Procedure Name: Intubation Date/Time: 01/22/2015 7:31 AM Performed by: Hewitt Blade Pre-anesthesia Checklist: Patient identified, Emergency Drugs available, Suction available and Patient being monitored Patient Re-evaluated:Patient Re-evaluated prior to inductionOxygen Delivery Method: Circle system utilized Preoxygenation: Pre-oxygenation with 100% oxygen Intubation Type: IV induction Ventilation: Mask ventilation without difficulty Laryngoscope Size: Mac and 3 Grade View: Grade I Tube size: 7.0 mm Number of attempts: 1 Airway Equipment and Method: Stylet Placement Confirmation: ETT inserted through vocal cords under direct vision,  positive ETCO2 and breath sounds checked- equal and bilateral Secured at: 21 cm Tube secured with: Tape Dental Injury: Teeth and Oropharynx as per pre-operative assessment

## 2015-01-25 ENCOUNTER — Encounter (HOSPITAL_COMMUNITY): Payer: Self-pay | Admitting: Obstetrics and Gynecology

## 2015-02-28 ENCOUNTER — Encounter (HOSPITAL_COMMUNITY): Payer: Self-pay | Admitting: *Deleted

## 2015-02-28 ENCOUNTER — Emergency Department (INDEPENDENT_AMBULATORY_CARE_PROVIDER_SITE_OTHER)
Admission: EM | Admit: 2015-02-28 | Discharge: 2015-02-28 | Disposition: A | Discharge status: Home / Self Care | Payer: 59 | Source: Home / Self Care | Attending: Family Medicine | Admitting: Family Medicine

## 2015-02-28 DIAGNOSIS — L02416 Cutaneous abscess of left lower limb: Secondary | ICD-10-CM | POA: Diagnosis not present

## 2015-02-28 MED ORDER — MUPIROCIN 2 % EX OINT: TOPICAL_OINTMENT | CUTANEOUS | Status: DC

## 2015-02-28 NOTE — Discharge Instructions (Signed)
Warm compress twice a day when you apply the antibiotic, use all of medicine, return as needed.

## 2015-02-28 NOTE — ED Notes (Signed)
Pt  Has   A  Sore on her     l  Lower  Leg     That  Has  Been there  For  sev  Months       Started out  As  A  Small  Pimple

## 2015-02-28 NOTE — ED Provider Notes (Signed)
CSN: [id number]     Arrival date & time 02/28/15  1300 History   First MD Initiated Contact with Patient 02/28/15 1318     Chief Complaint  Patient presents with  . Abscess   (Consider location/radiation/quality/duration/timing/severity/associated sxs/prior Treatment) Patient is a 39 y.o. female presenting with abscess. The history is provided by the patient.  Abscess Location:  Leg Leg abscess location:  L lower leg Abscess quality: painful   Abscess quality: not draining and no redness   Red streaking: no   Duration:  2 months Progression:  Resolved (here to get checked) Pain details:    Severity:  Mild   Progression:  Resolved (onset as red pimple lesion.) Chronicity:  New Context: skin injury   Relieved by:  None tried Worsened by:  Nothing tried Ineffective treatments:  None tried   Past Medical History  Diagnosis Date  . Environmental allergies   . Fibroid, uterine   . Wears glasses   . Allergy   . Asthma 05/2013  . 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 Wrist surgery    . Cesarean section  2011    With fibroid removal  . Wrist cyst    . Laparoscopic tubal ligation Left 12/11/2014    Procedure: LAPAROSCOPIC TUBAL LIGATION;  Surgeon: Arvella Nigh, MD;  Location: [facility];  Service: Gynecology;  Laterality: Left;  . Tubal ligation    . Removal of bone spur from right elbow     . Lipoma excision    . Laparoscopic tubal ligation Bilateral 01/22/2015    Procedure: LAPAROSCOPIC TUBAL LIGATION;  Surgeon: Arvella Nigh, MD;  Location: [facility];  Service: Gynecology;  Laterality: Bilateral;   Family History  Problem Relation Age of Onset  . Kidney disease Mother 84  . Diabetes Paternal Grandmother   . Cancer Paternal Grandmother     lung  . Birth defects Other     Developmental delay  . Diabetes Other   . Hypertension Other   . Thyroid disease Other   . Stroke Other   . Hypertension Other   . Hypertension Maternal  Grandfather    Social History  Substance Use Topics  . Smoking status: Never Smoker   . Smokeless tobacco: Never Used  . Alcohol Use: No   OB History    Gravida Para Term Preterm AB TAB SAB Ectopic Multiple Living   2 2        2      Review of Systems  Skin: Positive for rash.  All other systems reviewed and are negative.   Allergies  Review of patient's allergies indicates no known allergies.  Home Medications   Prior to Admission medications   Medication Sig Start Date End Date Taking? Authorizing Provider  albuterol (PROVENTIL HFA;VENTOLIN HFA) 108 (90 BASE) MCG/ACT inhaler Inhale 2 puffs into the lungs every 6 (six) hours as needed for wheezing. 09/16/13   Camelia Eng Tysinger, PA-C  albuterol (PROVENTIL) (2.5 MG/3ML) 0.083% nebulizer solution Take 3 mLs (2.5 mg total) by nebulization every 6 (six) hours as needed for wheezing or shortness of breath. 09/16/13   Camelia Eng Tysinger, PA-C  budesonide-formoterol (SYMBICORT) 80-4.5 MCG/ACT inhaler Inhale 2 puffs into the lungs as needed.    Historical Provider, MD  mupirocin ointment (BACTROBAN) 2 % Apply to skin bid 02/28/15   Billy Fischer, MD  sodium chloride (OCEAN) 0.65 % SOLN nasal spray Place 2 sprays into both nostrils  daily as needed for congestion.    Historical Provider, MD   Meds Ordered and Administered this Visit  Medications - No data to display  BP 140/81 mmHg  Pulse 77  Temp(Src) 97.8 F (36.6 C) (Oral)  SpO2 97%  LMP 02/14/2015 No data found.   Physical Exam  Constitutional: She appears well-nourished.  Skin: Skin is warm and dry. Rash noted.  Crusting nonluctuant 1cm circ lesion to left post ankle.  Nursing note and vitals reviewed.   ED Course  Procedures (including critical care time)  Labs Review Labs Reviewed - No data to display  Imaging Review No results found.   Visual Acuity Review  Right Eye Distance:   Left Eye Distance:   Bilateral Distance:    Right Eye Near:   Left Eye Near:     Bilateral Near:         MDM   1. Abscess of leg, left       Billy Fischer, MD 02/28/15 [phone]

## 2016-05-08 ENCOUNTER — Ambulatory Visit (INDEPENDENT_AMBULATORY_CARE_PROVIDER_SITE_OTHER): Payer: 59 | Admitting: Medical

## 2016-05-08 ENCOUNTER — Encounter: Payer: Self-pay | Admitting: Medical

## 2016-05-08 VITALS — BP 106/70 | HR 86 | Temp 98.2°F | Wt 161.2 lb

## 2016-05-08 DIAGNOSIS — J45901 Unspecified asthma with (acute) exacerbation: Secondary | ICD-10-CM | POA: Diagnosis not present

## 2016-05-08 DIAGNOSIS — R110 Nausea: Secondary | ICD-10-CM | POA: Diagnosis not present

## 2016-05-08 DIAGNOSIS — J069 Acute upper respiratory infection, unspecified: Secondary | ICD-10-CM | POA: Diagnosis not present

## 2016-05-08 DIAGNOSIS — B9789 Other viral agents as the cause of diseases classified elsewhere: Secondary | ICD-10-CM | POA: Diagnosis not present

## 2016-05-08 MED ORDER — ALBUTEROL SULFATE HFA 108 (90 BASE) MCG/ACT IN AERS: 2.0000 | INHALATION_SPRAY | Freq: Four times a day (QID) | RESPIRATORY_TRACT | 1 refills | Status: DC | PRN

## 2016-05-08 MED ORDER — PROMETHAZINE-DM 6.25-15 MG/5ML PO SYRP: 5.0000 mL | ORAL_SOLUTION | Freq: Four times a day (QID) | ORAL | 0 refills | Status: DC | PRN

## 2016-05-08 MED ORDER — ALBUTEROL SULFATE (2.5 MG/3ML) 0.083% IN NEBU: 2.5000 mg | INHALATION_SOLUTION | Freq: Four times a day (QID) | RESPIRATORY_TRACT | 0 refills | Status: DC | PRN

## 2016-05-08 MED ORDER — AZITHROMYCIN 250 MG PO TABS: ORAL_TABLET | ORAL | 0 refills | Status: DC

## 2016-05-08 NOTE — Progress Notes (Signed)
Subjective: Chief Complaint  Patient presents with  . coughing,    coughing, congestion    Here for illness.  Started feeling bad 4-5 days ago.  She reports cough, productive cough, nausea.  Has had some chest discomfort,but better today.   No fever, no chills.  No vomiting, but the nausea is bad.   Has felt SOB, but always wheezing. Used nebulizer few times recently.   No sick contacts.  Has had fever blister that started yesterday.   Using Dayquil and sore throat medication,chloraseptic.  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  Tried Symbicort 3 years ago but ran out.  Regarding cold sores, never been on valtrex.    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  No other aggravating or relieving factors. No other complaint.  Past Medical History:  Diagnosis Date  . Allergy   . 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 Asthma 05/2013  . Environmental allergies   . Fibroid, uterine   . Wears glasses    No current outpatient prescriptions on file prior to visit.   No current facility-administered medications on file prior to visit.    ROS as in subjective   Objective: BP 106/70   Pulse 86   Temp 98.2 F (36.8 C)   Wt 161 lb 3.2 oz (73.1 kg)   SpO2 96%   BMI 32.56 kg/m   General appearance: alert, no distress, WD/WN HEENT: normocephalic, sclerae anicteric, TMs pearly, nares patent, no discharge or erythema, pharynx normal Oral cavity: MMM, no lesions, small veiscular lesion of right lower lip Neck: supple, no lymphadenopathy, no thyromegaly, no masses Heart: RRR, normal S1, S2, no murmurs Lungs: decreased breath sounds, faint wheezing, no dullness Pulses: 2+ symmetric, upper and lower extremities, normal cap refill Ext: no edema    Assessment: Encounter Diagnoses  Name Primary?  . Exacerbation of asthma, unspecified asthma severity, unspecified whether persistent Yes  . Viral URI   . Nausea      Plan: Discussed symptoms, exam findings, recommendations.    Currently etiology seems to be viral URI exacerbating asthma.  Begin albuterol q4-6 hours x next several days until much improved, rest, hydrate well.   If worsening productive mucous, fever, or not seeing improving in 2-3 days, can begin zpak, use as watch and wait.  Currently though things suggest viral etiology.     Gave supportive care recommendation for cold sore.  Return for physical at her convenience  Karl was seen today for coughing,.  Diagnoses and all orders for this visit:  Exacerbation of asthma, unspecified asthma severity, unspecified whether persistent  Viral URI  Nausea  Other orders -     albuterol (PROVENTIL) (2.5 MG/3ML) 0.083% nebulizer solution; Take 3 mLs (2.5 mg total) by nebulization every 6 (six) hours as needed for wheezing or shortness of breath. -     albuterol (PROVENTIL HFA;VENTOLIN HFA) 108 (90 Base) MCG/ACT inhaler; Inhale 2 puffs into the lungs every 6 (six) hours as needed for wheezing. -     promethazine-dextromethorphan (PROMETHAZINE-DM) 6.25-15 MG/5ML syrup; Take 5 mLs by mouth 4 (four) times daily as needed for cough. -     azithromycin (ZITHROMAX) 250 MG tablet; 2 tablets day 1, then 1 tablet days 2-4

## 2017-03-10 NOTE — H&P (Signed)
Patient name Claudia Hendrix, Claudia Hendrix DICTATION# [id number] CSN# [id number]  Darlyn Chamber, MD 03/10/2017 6:26 AM

## 2017-03-10 NOTE — H&P (Signed)
NAME:  AZIAH, KAISER NO.:  [id number]  MEDICAL RECORD NO.:  [id number]  LOCATION:                                 FACILITY:  PHYSICIAN:  Darlyn Chamber, M.D.        DATE OF BIRTH:  DATE OF ADMISSION: DATE OF DISCHARGE:                             HISTORY & PHYSICAL   DATE OF SURGERY:  March 29, 2017, at [facility]:  The patient is a 41 year old gravida 2, para 2 female presents for laparoscopic-assisted vaginal hysterectomy with removal of both fallopian tubes.  She had an ultrasound done that did reveal numerous fibroids.  Endometrium was unremarkable.  Ovaries unremarkable.  She has had problems with abnormal uterine bleeding and now presents for laparoscopic-assisted vaginal hysterectomy.  Uterus on exam is about 9-10 weeks' size.  She is also having pain with intercourse associated with the above symptomatology.  We had discussed other options including radiological embolization or trying to control the bleeding with hormonal agents such as birth control pills.  She declines that wishing definitive therapy.  ALLERGIES:  She has no known drug allergies.  MEDICATIONS:  She uses inhaler for asthma.  PAST MEDICAL HISTORY:  Usual childhood diseases.  No significant sequelae.  PAST SURGERY HISTORY:  She has asthmatic bronchitis.  She has had 1 vaginal delivery, one cesarean section.  She has had wrist and forearm surgery.  She had a previous hysteroscopic evaluation with passing of an Essure only on the right and subsequently had laparoscopic left tubal ligation.  SOCIAL HISTORY:  No tobacco or alcohol use.  FAMILY HISTORY:  Noncontributory.  REVIEW OF SYSTEMS:  Noncontributory.  PHYSICAL EXAMINATION:  VITAL SIGNS:  The patient is afebrile.  Stable vital signs. HEENT:  The patient is normocephalic.  The pupils are equal, round, and reactive to light and accommodation.  Extraocular movements were intact. Sclerae  and conjunctivae were clear.  Oropharynx clear. NECK:  Without thyromegaly. BREASTS:  Not examined. LUNGS:  Clear. CARDIOVASCULAR:  Regular rhythm and rate without murmurs or gallops. ABDOMEN:  Benign.  No mass, organomegaly or tenderness. PELVIC:  Normal external genitalia.  Vaginal mucosa is clear.  Cervix unremarkable.  Uterus approximately 9-10 weeks in size.  Adnexa unremarkable. EXTREMITIES:  Trace edema. NEUROLOGIC:  Grossly within normal limits.  IMPRESSION:  Abnormal uterine bleeding secondary to uterine fibroids.  PLAN:  The patient to undergo laparoscopic-assisted vaginal hysterectomy, removal of both fallopian tubes.  The risks of surgery have been explained including the risk of infection.  Risk of hemorrhage that could require transfusion with the risk of AIDS or hepatitis.  Risk of injury to adjacent organs such as bladder, bowel, ureters that could require further exploratory surgery, risk of deep venous thrombosis and pulmonary embolus.  We also discussed the possible need for an abdominal exploratory surgery to complete the operation.  The patient expressed understanding of the indications and risks and alternatives.     Darlyn Chamber, M.D.     JSM/MEDQ  D:  03/10/2017  T:  03/10/2017  Job:  [id number]

## 2017-03-14 ENCOUNTER — Encounter (HOSPITAL_COMMUNITY): Payer: Self-pay

## 2017-03-14 NOTE — Patient Instructions (Addendum)
Your procedure is scheduled on: Thursday March 29, 2017 at 7:30 am  Enter through the [employer] [facility] at: 6:00 am Pick up the phone at the desk and dial [phone].  Call this number if you have problems the morning of surgery: [phone].  Remember: Do NOT eat food or drink any liquids: after Midnight on Wednesday March 28, 2017  Take these medicines the morning of surgery with a SIP OF WATER: albuterol inhaler as needed BRING ALBUTEROL INHALER DAY OF SURGERY  STOP ALL VITAMINS AND SUPPLEMENTS 1 WEEK PRIOR TO SURGERY  DO NOT SMOKE DAY OF SURGERY  Do NOT wear jewelry (body piercing), metal hair clips/bobby pins, make-up, or nail polish. Do NOT wear lotions, powders, or perfumes.  You may wear deoderant. Do NOT shave for 48 hours prior to surgery. Do NOT bring valuables to the hospital. Contacts, dentures, or bridgework may not be worn into surgery. Leave suitcase in car.  After surgery it may be brought to your room.  For patients admitted to the hospital, checkout time is 11:00 AM the day of discharge.

## 2017-03-19 ENCOUNTER — Encounter (HOSPITAL_COMMUNITY)
Admission: RE | Admit: 2017-03-19 | Discharge: 2017-03-19 | Disposition: A | Discharge status: Home / Self Care | Payer: 59 | Source: Ambulatory Visit | Attending: Obstetrics and Gynecology | Admitting: Obstetrics and Gynecology

## 2017-03-19 ENCOUNTER — Encounter (HOSPITAL_COMMUNITY): Payer: Self-pay

## 2017-03-19 ENCOUNTER — Other Ambulatory Visit: Payer: Self-pay

## 2017-03-19 DIAGNOSIS — D259 Leiomyoma of uterus, unspecified: Secondary | ICD-10-CM | POA: Insufficient documentation

## 2017-03-19 DIAGNOSIS — Z9889 Other specified postprocedural states: Secondary | ICD-10-CM | POA: Diagnosis not present

## 2017-03-19 DIAGNOSIS — N939 Abnormal uterine and vaginal bleeding, unspecified: Secondary | ICD-10-CM | POA: Insufficient documentation

## 2017-03-19 DIAGNOSIS — Z01812 Encounter for preprocedural laboratory examination: Secondary | ICD-10-CM | POA: Insufficient documentation

## 2017-03-19 DIAGNOSIS — J45909 Unspecified asthma, uncomplicated: Secondary | ICD-10-CM | POA: Diagnosis not present

## 2017-03-19 LAB — TYPE AND SCREEN
ABO/RH(D): O POS
Antibody Screen: NEGATIVE

## 2017-03-19 LAB — CBC
HEMATOCRIT: 39.6 % (ref 36.0–46.0)
Hemoglobin: 13.6 g/dL (ref 12.0–15.0)
MCH: 31.3 pg (ref 26.0–34.0)
MCHC: 34.3 g/dL (ref 30.0–36.0)
MCV: 91 fL (ref 78.0–100.0)
Platelets: 234 10*3/uL (ref 150–400)
RBC: 4.35 MIL/uL (ref 3.87–5.11)
RDW: 12.4 % (ref 11.5–15.5)
WBC: 10.4 10*3/uL (ref 4.0–10.5)

## 2017-03-19 LAB — ABO/RH: ABO/RH(D): O POS

## 2017-03-28 NOTE — Anesthesia Preprocedure Evaluation (Addendum)
Anesthesia Evaluation  Patient identified by MRN, date of birth, ID band Patient awake    Reviewed: Allergy & Precautions, H&P , NPO status , Patient's Chart, lab work & pertinent test results, reviewed documented beta blocker date and time   Airway Mallampati: II  TM Distance: >3 FB Neck ROM: Full    Dental no notable dental hx. (+) Teeth Intact   Pulmonary asthma ,    Pulmonary exam normal breath sounds clear to auscultation       Cardiovascular Exercise Tolerance: Good negative cardio ROS Normal cardiovascular exam Rhythm:Regular Rate:Normal     Neuro/Psych negative neurological ROS  negative psych ROS   GI/Hepatic negative GI ROS, Neg liver ROS,   Endo/Other  Obesity   Renal/GU negative Renal ROS  negative genitourinary   Musculoskeletal  (+) Arthritis ,   Abdominal   Peds  Hematology negative hematology ROS (+)   Anesthesia Other Findings Day of surgery medications reviewed with the patient.  Reproductive/Obstetrics Desires sterilization                             Anesthesia Physical  Anesthesia Plan  ASA: II  Anesthesia Plan: General   Post-op Pain Management:    Induction: Intravenous  PONV Risk Score and Plan: 4 or greater and Dexamethasone, Ondansetron, Scopolamine patch - Pre-op and Midazolam  Airway Management Planned: Oral ETT  Additional Equipment:   Intra-op Plan:   Post-operative Plan: Extubation in OR  Informed Consent: I have reviewed the patients History and Physical, chart, labs and discussed the procedure including the risks, benefits and alternatives for the proposed anesthesia with the patient or authorized representative who has indicated his/her understanding and acceptance.   Dental advisory given  Plan Discussed with: CRNA, Surgeon and Anesthesiologist  Anesthesia Plan Comments:        Anesthesia Quick Evaluation

## 2017-03-29 ENCOUNTER — Encounter (HOSPITAL_COMMUNITY)
Admission: AD | Disposition: A | Discharge status: Home / Self Care | Payer: Self-pay | Source: Ambulatory Visit | Attending: Obstetrics and Gynecology

## 2017-03-29 ENCOUNTER — Other Ambulatory Visit: Payer: Self-pay

## 2017-03-29 ENCOUNTER — Ambulatory Visit (HOSPITAL_COMMUNITY): Payer: 59 | Admitting: Anesthesiology

## 2017-03-29 ENCOUNTER — Observation Stay (HOSPITAL_COMMUNITY)
Admission: AD | Admit: 2017-03-29 | Discharge: 2017-03-30 | Disposition: A | Discharge status: Home / Self Care | Payer: 59 | Source: Ambulatory Visit | Attending: Obstetrics and Gynecology | Admitting: Obstetrics and Gynecology

## 2017-03-29 ENCOUNTER — Encounter (HOSPITAL_COMMUNITY): Payer: Self-pay | Admitting: Emergency Medicine

## 2017-03-29 DIAGNOSIS — Z9889 Other specified postprocedural states: Secondary | ICD-10-CM | POA: Diagnosis not present

## 2017-03-29 DIAGNOSIS — N736 Female pelvic peritoneal adhesions (postinfective): Secondary | ICD-10-CM | POA: Insufficient documentation

## 2017-03-29 DIAGNOSIS — J45909 Unspecified asthma, uncomplicated: Secondary | ICD-10-CM | POA: Insufficient documentation

## 2017-03-29 DIAGNOSIS — D282 Benign neoplasm of uterine tubes and ligaments: Secondary | ICD-10-CM | POA: Insufficient documentation

## 2017-03-29 DIAGNOSIS — M1990 Unspecified osteoarthritis, unspecified site: Secondary | ICD-10-CM | POA: Diagnosis not present

## 2017-03-29 DIAGNOSIS — E669 Obesity, unspecified: Secondary | ICD-10-CM | POA: Diagnosis not present

## 2017-03-29 DIAGNOSIS — D250 Submucous leiomyoma of uterus: Secondary | ICD-10-CM | POA: Insufficient documentation

## 2017-03-29 DIAGNOSIS — D259 Leiomyoma of uterus, unspecified: Secondary | ICD-10-CM | POA: Diagnosis present

## 2017-03-29 DIAGNOSIS — Z90710 Acquired absence of both cervix and uterus: Secondary | ICD-10-CM | POA: Diagnosis present

## 2017-03-29 DIAGNOSIS — D251 Intramural leiomyoma of uterus: Secondary | ICD-10-CM | POA: Diagnosis not present

## 2017-03-29 DIAGNOSIS — Z6832 Body mass index (BMI) 32.0-32.9, adult: Secondary | ICD-10-CM | POA: Insufficient documentation

## 2017-03-29 HISTORY — PX: CYSTOSCOPY: SHX5120

## 2017-03-29 HISTORY — PX: LAPAROSCOPIC VAGINAL HYSTERECTOMY WITH SALPINGECTOMY: SHX6680

## 2017-03-29 LAB — HCG, SERUM, QUALITATIVE: Preg, Serum: NEGATIVE

## 2017-03-29 SURGERY — HYSTERECTOMY, VAGINAL, LAPAROSCOPY-ASSISTED, WITH SALPINGECTOMY
Anesthesia: General

## 2017-03-29 MED ORDER — FENTANYL CITRATE (PF) 250 MCG/5ML IJ SOLN
INTRAMUSCULAR | Status: AC
  Filled 2017-03-29: qty 5

## 2017-03-29 MED ORDER — HYDROMORPHONE HCL 1 MG/ML IJ SOLN
INTRAMUSCULAR | Status: AC
  Filled 2017-03-29: qty 1

## 2017-03-29 MED ORDER — HYDROMORPHONE 1 MG/ML IV SOLN
INTRAVENOUS | Status: DC
  Administered 2017-03-29: 3 mg via INTRAVENOUS
  Administered 2017-03-29: 11:00:00 via INTRAVENOUS
  Administered 2017-03-29: 1.7 mg via INTRAVENOUS
  Filled 2017-03-29 (×2): qty 25

## 2017-03-29 MED ORDER — DEXAMETHASONE SODIUM PHOSPHATE 10 MG/ML IJ SOLN
INTRAMUSCULAR | Status: DC | PRN
  Administered 2017-03-29: 10 mg via INTRAVENOUS

## 2017-03-29 MED ORDER — CEFAZOLIN SODIUM-DEXTROSE 2-3 GM-%(50ML) IV SOLR
INTRAVENOUS | Status: AC
  Filled 2017-03-29: qty 50

## 2017-03-29 MED ORDER — LACTATED RINGERS IV SOLN
INTRAVENOUS | Status: DC
  Administered 2017-03-29 (×2): via INTRAVENOUS

## 2017-03-29 MED ORDER — KETOROLAC TROMETHAMINE 30 MG/ML IJ SOLN
INTRAMUSCULAR | Status: AC
  Filled 2017-03-29: qty 1

## 2017-03-29 MED ORDER — ALBUTEROL SULFATE HFA 108 (90 BASE) MCG/ACT IN AERS: 2.0000 | INHALATION_SPRAY | Freq: Four times a day (QID) | RESPIRATORY_TRACT | Status: DC | PRN

## 2017-03-29 MED ORDER — LIDOCAINE HCL (CARDIAC) 20 MG/ML IV SOLN
INTRAVENOUS | Status: DC | PRN
  Administered 2017-03-29: 80 mg via INTRAVENOUS

## 2017-03-29 MED ORDER — OXYCODONE-ACETAMINOPHEN 7.5-325 MG PO TABS
1.0000 | ORAL_TABLET | Freq: Four times a day (QID) | ORAL | Status: DC | PRN
  Administered 2017-03-29: 2 via ORAL
  Administered 2017-03-30: 1 via ORAL
  Filled 2017-03-29: qty 2
  Filled 2017-03-29: qty 1

## 2017-03-29 MED ORDER — ACETAMINOPHEN 500 MG PO TABS
1000.0000 mg | ORAL_TABLET | Freq: Once | ORAL | Status: AC
  Administered 2017-03-29: 1000 mg via ORAL

## 2017-03-29 MED ORDER — FENTANYL CITRATE (PF) 100 MCG/2ML IJ SOLN
25.0000 ug | INTRAMUSCULAR | Status: DC | PRN
  Administered 2017-03-29: 50 ug via INTRAVENOUS

## 2017-03-29 MED ORDER — DEXAMETHASONE SODIUM PHOSPHATE 10 MG/ML IJ SOLN
INTRAMUSCULAR | Status: AC
  Filled 2017-03-29: qty 1

## 2017-03-29 MED ORDER — SUGAMMADEX SODIUM 200 MG/2ML IV SOLN
INTRAVENOUS | Status: AC
  Filled 2017-03-29: qty 2

## 2017-03-29 MED ORDER — FLUORESCEIN SODIUM 10 % IV SOLN
INTRAVENOUS | Status: AC
  Filled 2017-03-29: qty 5

## 2017-03-29 MED ORDER — ROCURONIUM BROMIDE 100 MG/10ML IV SOLN
INTRAVENOUS | Status: AC
  Filled 2017-03-29: qty 1

## 2017-03-29 MED ORDER — LIDOCAINE HCL 1 % IJ SOLN
INTRAMUSCULAR | Status: AC
  Filled 2017-03-29: qty 20

## 2017-03-29 MED ORDER — BUPIVACAINE HCL (PF) 0.25 % IJ SOLN
INTRAMUSCULAR | Status: AC
  Filled 2017-03-29: qty 30

## 2017-03-29 MED ORDER — HYDROMORPHONE HCL 1 MG/ML IJ SOLN
INTRAMUSCULAR | Status: DC | PRN
  Administered 2017-03-29 (×2): 0.5 mg via INTRAVENOUS

## 2017-03-29 MED ORDER — DIPHENHYDRAMINE HCL 50 MG/ML IJ SOLN
12.5000 mg | Freq: Four times a day (QID) | INTRAMUSCULAR | Status: DC | PRN
Start: 2017-03-29 — End: 2017-03-29

## 2017-03-29 MED ORDER — FENTANYL CITRATE (PF) 100 MCG/2ML IJ SOLN
INTRAMUSCULAR | Status: AC
  Filled 2017-03-29: qty 2

## 2017-03-29 MED ORDER — ACETAMINOPHEN 500 MG PO TABS
ORAL_TABLET | ORAL | Status: AC
  Administered 2017-03-29: 1000 mg via ORAL
  Filled 2017-03-29: qty 2

## 2017-03-29 MED ORDER — LIDOCAINE HCL (CARDIAC) 20 MG/ML IV SOLN
INTRAVENOUS | Status: AC
  Filled 2017-03-29: qty 5

## 2017-03-29 MED ORDER — MENTHOL 3 MG MT LOZG: 1.0000 | LOZENGE | OROMUCOSAL | Status: DC | PRN

## 2017-03-29 MED ORDER — ROCURONIUM BROMIDE 100 MG/10ML IV SOLN
INTRAVENOUS | Status: DC | PRN
  Administered 2017-03-29 (×2): 10 mg via INTRAVENOUS
  Administered 2017-03-29: 40 mg via INTRAVENOUS

## 2017-03-29 MED ORDER — ONDANSETRON HCL 4 MG/2ML IJ SOLN
INTRAMUSCULAR | Status: DC | PRN
  Administered 2017-03-29: 4 mg via INTRAVENOUS

## 2017-03-29 MED ORDER — ACETAMINOPHEN 325 MG PO TABS: 650.0000 mg | ORAL_TABLET | ORAL | Status: DC | PRN

## 2017-03-29 MED ORDER — 0.9 % SODIUM CHLORIDE (POUR BTL) OPTIME
TOPICAL | Status: DC | PRN
  Administered 2017-03-29: 1000 mL

## 2017-03-29 MED ORDER — DIPHENHYDRAMINE HCL 12.5 MG/5ML PO ELIX
12.5000 mg | ORAL_SOLUTION | Freq: Four times a day (QID) | ORAL | Status: DC | PRN
  Filled 2017-03-29: qty 5

## 2017-03-29 MED ORDER — SODIUM CHLORIDE 0.9 % IR SOLN
Status: DC | PRN
  Administered 2017-03-29: 3000 mL

## 2017-03-29 MED ORDER — SCOPOLAMINE 1 MG/3DAYS TD PT72
MEDICATED_PATCH | TRANSDERMAL | Status: AC
  Administered 2017-03-29: 1.5 mg via TRANSDERMAL
  Filled 2017-03-29: qty 1

## 2017-03-29 MED ORDER — ALBUTEROL SULFATE (2.5 MG/3ML) 0.083% IN NEBU: 2.5000 mg | INHALATION_SOLUTION | Freq: Four times a day (QID) | RESPIRATORY_TRACT | Status: DC | PRN

## 2017-03-29 MED ORDER — PROPOFOL 10 MG/ML IV BOLUS
INTRAVENOUS | Status: DC | PRN
  Administered 2017-03-29: 10 mg via INTRAVENOUS

## 2017-03-29 MED ORDER — ONDANSETRON HCL 4 MG/2ML IJ SOLN
INTRAMUSCULAR | Status: AC
  Filled 2017-03-29: qty 2

## 2017-03-29 MED ORDER — PROMETHAZINE HCL 25 MG/ML IJ SOLN: 6.2500 mg | INTRAMUSCULAR | Status: DC | PRN

## 2017-03-29 MED ORDER — SCOPOLAMINE 1 MG/3DAYS TD PT72
1.0000 | MEDICATED_PATCH | Freq: Once | TRANSDERMAL | Status: DC
  Administered 2017-03-29: 1.5 mg via TRANSDERMAL

## 2017-03-29 MED ORDER — MIDAZOLAM HCL 2 MG/2ML IJ SOLN
INTRAMUSCULAR | Status: AC
  Filled 2017-03-29: qty 2

## 2017-03-29 MED ORDER — BUPIVACAINE HCL (PF) 0.25 % IJ SOLN
INTRAMUSCULAR | Status: DC | PRN
  Administered 2017-03-29: 4 mL

## 2017-03-29 MED ORDER — SUGAMMADEX SODIUM 200 MG/2ML IV SOLN
INTRAVENOUS | Status: DC | PRN
  Administered 2017-03-29: 200 mg via INTRAVENOUS

## 2017-03-29 MED ORDER — SODIUM CHLORIDE 0.9% FLUSH: 9.0000 mL | INTRAVENOUS | Status: DC | PRN

## 2017-03-29 MED ORDER — STERILE WATER FOR IRRIGATION IR SOLN
Status: DC | PRN
  Administered 2017-03-29: 1000 mL via INTRAVESICAL

## 2017-03-29 MED ORDER — METOPROLOL TARTRATE 5 MG/5ML IV SOLN
INTRAVENOUS | Status: AC
  Filled 2017-03-29: qty 5

## 2017-03-29 MED ORDER — KETOROLAC TROMETHAMINE 30 MG/ML IJ SOLN
INTRAMUSCULAR | Status: DC | PRN
  Administered 2017-03-29: 30 mg via INTRAVENOUS

## 2017-03-29 MED ORDER — NALOXONE HCL 0.4 MG/ML IJ SOLN
INTRAMUSCULAR | Status: AC
  Filled 2017-03-29: qty 1

## 2017-03-29 MED ORDER — PROPOFOL 10 MG/ML IV BOLUS
INTRAVENOUS | Status: AC
  Filled 2017-03-29: qty 20

## 2017-03-29 MED ORDER — ONDANSETRON HCL 4 MG PO TABS: 4.0000 mg | ORAL_TABLET | Freq: Four times a day (QID) | ORAL | Status: DC | PRN

## 2017-03-29 MED ORDER — LACTATED RINGERS IV SOLN
INTRAVENOUS | Status: DC
  Administered 2017-03-29: 20:00:00 via INTRAVENOUS
  Administered 2017-03-29: 1000 mL via INTRAVENOUS

## 2017-03-29 MED ORDER — NALOXONE HCL 0.4 MG/ML IJ SOLN: 0.4000 mg | INTRAMUSCULAR | Status: DC | PRN

## 2017-03-29 MED ORDER — CEFAZOLIN SODIUM-DEXTROSE 2-4 GM/100ML-% IV SOLN
2.0000 g | INTRAVENOUS | Status: AC
  Administered 2017-03-29: 2 g via INTRAVENOUS
  Filled 2017-03-29: qty 100

## 2017-03-29 MED ORDER — FENTANYL CITRATE (PF) 100 MCG/2ML IJ SOLN
INTRAMUSCULAR | Status: DC | PRN
  Administered 2017-03-29: 50 ug via INTRAVENOUS
  Administered 2017-03-29 (×2): 100 ug via INTRAVENOUS

## 2017-03-29 MED ORDER — MIDAZOLAM HCL 2 MG/2ML IJ SOLN
INTRAMUSCULAR | Status: DC | PRN
  Administered 2017-03-29: 2 mg via INTRAVENOUS

## 2017-03-29 MED ORDER — ONDANSETRON HCL 4 MG/2ML IJ SOLN: 4.0000 mg | Freq: Four times a day (QID) | INTRAMUSCULAR | Status: DC | PRN

## 2017-03-29 SURGICAL SUPPLY — 48 items
ADH SKN CLS APL DERMABOND .7 (GAUZE/BANDAGES/DRESSINGS) ×2
CABLE HIGH FREQUENCY MONO STRZ (ELECTRODE) IMPLANT
CATH ROBINSON RED A/P 16FR (CATHETERS) ×4 IMPLANT
CONT PATH 16OZ SNAP LID 3702 (MISCELLANEOUS) ×4 IMPLANT
COVER BACK TABLE 60X90IN (DRAPES) ×4 IMPLANT
COVER MAYO STAND STRL (DRAPES) ×4 IMPLANT
DECANTER SPIKE VIAL GLASS SM (MISCELLANEOUS) IMPLANT
DERMABOND ADVANCED (GAUZE/BANDAGES/DRESSINGS) ×2
DERMABOND ADVANCED .7 DNX12 (GAUZE/BANDAGES/DRESSINGS) ×2 IMPLANT
DRSG OPSITE POSTOP 3X4 (GAUZE/BANDAGES/DRESSINGS) ×2 IMPLANT
DURAPREP 26ML APPLICATOR (WOUND CARE) ×4 IMPLANT
ELECT REM PT RETURN 9FT ADLT (ELECTROSURGICAL) ×8
ELECTRODE REM PT RTRN 9FT ADLT (ELECTROSURGICAL) IMPLANT
FILTER SMOKE EVAC LAPAROSHD (FILTER) IMPLANT
GLOVE BIO SURGEON STRL SZ7 (GLOVE) ×8 IMPLANT
GLOVE BIOGEL PI IND STRL 6.5 (GLOVE) ×2 IMPLANT
GLOVE BIOGEL PI IND STRL 7.0 (GLOVE) ×6 IMPLANT
GLOVE BIOGEL PI INDICATOR 6.5 (GLOVE) ×2
GLOVE BIOGEL PI INDICATOR 7.0 (GLOVE) ×6
LEGGING LITHOTOMY PAIR STRL (DRAPES) ×4 IMPLANT
NEEDLE INSUFFLATION 120MM (ENDOMECHANICALS) IMPLANT
NS IRRIG 1000ML POUR BTL (IV SOLUTION) ×4 IMPLANT
PACK LAVH (CUSTOM PROCEDURE TRAY) ×4 IMPLANT
PACK ROBOTIC GOWN (GOWN DISPOSABLE) ×4 IMPLANT
PACK TRENDGUARD 450 HYBRID PRO (MISCELLANEOUS) IMPLANT
PACK TRENDGUARD 600 HYBRD PROC (MISCELLANEOUS) IMPLANT
PROTECTOR NERVE ULNAR (MISCELLANEOUS) ×8 IMPLANT
SEALER TISSUE G2 CVD JAW 45CM (ENDOMECHANICALS) ×4 IMPLANT
SET CYSTO W/LG BORE CLAMP LF (SET/KITS/TRAYS/PACK) ×2 IMPLANT
SET IRRIG TUBING LAPAROSCOPIC (IRRIGATION / IRRIGATOR) ×2 IMPLANT
SLEEVE XCEL OPT CAN 5 100 (ENDOMECHANICALS) ×4 IMPLANT
SUT MON AB 2-0 CT1 27 (SUTURE) ×8 IMPLANT
SUT VIC AB 0 CT1 18XCR BRD8 (SUTURE) ×4 IMPLANT
SUT VIC AB 0 CT1 27 (SUTURE) ×4
SUT VIC AB 0 CT1 27XBRD ANBCTR (SUTURE) ×2 IMPLANT
SUT VIC AB 0 CT1 36 (SUTURE) ×4 IMPLANT
SUT VIC AB 0 CT1 8-18 (SUTURE) ×8
SUT VIC AB 4-0 PS2 27 (SUTURE) ×4 IMPLANT
SUT VICRYL 0 UR6 27IN ABS (SUTURE) ×2 IMPLANT
SUT VICRYL 1 TIES 12X18 (SUTURE) ×4 IMPLANT
TOWEL OR 17X24 6PK STRL BLUE (TOWEL DISPOSABLE) ×8 IMPLANT
TRAY FOLEY CATH SILVER 14FR (SET/KITS/TRAYS/PACK) ×4 IMPLANT
TRENDGUARD 450 HYBRID PRO PACK (MISCELLANEOUS) ×4
TRENDGUARD 600 HYBRID PROC PK (MISCELLANEOUS)
TROCAR BALLN 12MMX100 BLUNT (TROCAR) ×2 IMPLANT
TROCAR OPTI TIP 5M 100M (ENDOMECHANICALS) ×4 IMPLANT
TROCAR XCEL DIL TIP R 11M (ENDOMECHANICALS) ×2 IMPLANT
WARMER LAPAROSCOPE (MISCELLANEOUS) ×4 IMPLANT

## 2017-03-29 NOTE — Anesthesia Procedure Notes (Signed)
Procedure Name: Intubation Date/Time: 03/29/2017 7:25 AM Performed by: Asher Muir, CRNA Pre-anesthesia Checklist: Patient identified, Emergency Drugs available, Suction available and Patient being monitored Patient Re-evaluated:Patient Re-evaluated prior to induction Oxygen Delivery Method: Circle system utilized and Simple face mask Preoxygenation: Pre-oxygenation with 100% oxygen Induction Type: IV induction, Combination inhalational/ intravenous induction and Cricoid Pressure applied Ventilation: Mask ventilation without difficulty Laryngoscope Size: Mac and 3 Grade View: Grade III Tube type: Oral Tube size: 7.0 mm Number of attempts: 1 Airway Equipment and Method: Stylet Placement Confirmation: ETT inserted through vocal cords under direct vision,  positive ETCO2 and breath sounds checked- equal and bilateral Secured at: 20 (right lip) cm Tube secured with: Tape Dental Injury: Teeth and Oropharynx as per pre-operative assessment

## 2017-03-29 NOTE — Progress Notes (Signed)
Dr Radene Knee at bedside to round on patient.  He was advised about drainage on honey comb dressing at the unbilicus.  He changed the dressing to a 4x4 with tape to put pressure on site.

## 2017-03-29 NOTE — Anesthesia Postprocedure Evaluation (Signed)
Anesthesia Post Note  Patient: LUEVENIA MCAVOY  Procedure(s) Performed: LAPAROSCOPIC ASSISTED VAGINAL HYSTERECTOMY WITH SALPINGECTOMY (Bilateral ) CYSTOSCOPY (N/A )     Patient location during evaluation: [facility] Anesthesia Type: General Level of consciousness: awake and alert and oriented Pain management: pain level controlled Vital Signs Assessment: post-procedure vital signs reviewed and stable Respiratory status: nonlabored ventilation, respiratory function stable and patient connected to nasal cannula oxygen Cardiovascular status: blood pressure returned to baseline Postop Assessment: adequate PO intake and no apparent nausea or vomiting Anesthetic complications: no    Last Vitals:  Vitals:   03/29/17 1357 03/29/17 1402  BP:  (!) 144/82  Pulse:  69  Resp: 16 16  Temp:  36.8 C  SpO2: 100% 100%    Last Pain:  Vitals:   03/29/17 1402  TempSrc: Oral  PainSc:    Pain Goal: Patients Stated Pain Goal: 3 (03/29/17 1225)               Willa Rough

## 2017-03-29 NOTE — Progress Notes (Signed)
Patient ID: Claudia Hendrix, female   DOB: 02/04/1976, 41 y.o.   MRN: [id number] AF [facility]SS [employer]MB[employer] WITH SOME BLEEDIGN PRESSURE DRESSING APPLIED GOOD UO MINIMAL VAGINAL BLEEDGING

## 2017-03-29 NOTE — H&P (Signed)
  History and physical exam unchanged 

## 2017-03-29 NOTE — Transfer of Care (Signed)
Immediate Anesthesia Transfer of Care Note  Patient: Claudia Hendrix  Procedure(s) Performed: LAPAROSCOPIC ASSISTED VAGINAL HYSTERECTOMY WITH SALPINGECTOMY (Bilateral ) CYSTOSCOPY (N/A )  Patient Location: PACU  Anesthesia Type:General  Level of Consciousness: awake, alert  and oriented  Airway & Oxygen Therapy: Patient Spontanous Breathing and Patient connected to nasal cannula oxygen  Post-op Assessment: Report given to RN, Post -op Vital signs reviewed and stable and Patient moving all extremities  Post vital signs: Reviewed and stable  Last Vitals:  Vitals:   03/29/17 0621  BP: 128/76  Pulse: 91  Resp: 16  Temp: 36.6 C  SpO2: 100%    Last Pain:  Vitals:   03/29/17 0621  TempSrc: Oral      Patients Stated Pain Goal: 3 (09/81/19 1478)  Complications: No apparent anesthesia complications

## 2017-03-29 NOTE — Brief Op Note (Signed)
Patient name   Claudia Hendrix, Claudia Hendrix DICTATION# [id number] CSN# [id number]  Darlyn Chamber, MD 03/29/2017 9:22 AM

## 2017-03-29 NOTE — Op Note (Signed)
NAME:  Claudia Hendrix, Claudia Hendrix NO.:  [id number]  MEDICAL RECORD NO.:  [id number]  LOCATION:                                 FACILITY:  PHYSICIAN:  Darlyn Chamber, M.D.        DATE OF BIRTH:  DATE OF PROCEDURE:  03/29/2017 DATE OF DISCHARGE:                              OPERATIVE REPORT   PREOPERATIVE DIAGNOSIS:  Symptomatic uterine fibroids.  POSTOPERATIVE DIAGNOSIS:  Symptomatic uterine fibroids.  PROCEDURE:  Laparoscopic assisted vaginal hysterectomy with removal of both fallopian tubes and cystoscopy.  SURGEONS:  Darlyn Chamber, M.D.  ASSISTANT:  Dr. Matthew Saras.  ANESTHESIA:  General endotracheal.  ESTIMATED BLOOD LOSS:  400 to 500 mL.  PACKS:  None.  DRAINS:  Included urethral Foley.  INTRAOPERATIVE BLOOD PLACED:  None.  COMPLICATIONS:  None.  INDICATION:  Dictated in history and physical.  PROCEDURE IN DETAIL:  The patient was taken to the OR, placed in supine position.  After satisfactory level of general endotracheal anesthesia was obtained, the patient was placed in the dorsal lithotomy position using the Allen stirrups.  Perineum and vagina were prepped out with Betadine.  Abdomen is prepped out with DuraPrep.  Bladder was emptied by in-and-out catheterization.  A Hulka tenaculum was put in place and secured.  The patient was then draped in sterile field.  Subumbilical incision was made with knife.  This was carried through the subcutaneous tissue.  Fascia was entered sharply.  Incision in the fascia was extended laterally.  Peritoneum was entered with blunt finger pressure. Open laparoscopic trocar was put in place, secured.  Laparoscope was introduced.  There was no evidence of injury to adjacent organs.  A 5-mm trocar was put in place in the suprapubic area under direct visualization.  She had multiple fibroids.  She had a large one on the right uterine side and a posterior fundal fibroid.  Both ovaries appeared to be normal.  The left tube had  a previous bilateral tubal ligation, the right tube had an Essure.  We first went to the left side. The left round ligament was cauterized and incised using the EnSeal. The left ovarian pedicle was cauterized and incised using the EnSeal. We had good hemostasis.  I left that fallopian tube in place.  We then cauterized and incised some of the parametrial tissue.  We then went to the right side.  The right round ligament was cauterized and incised. We elevated the right fallopian tube.  We cauterized the peritoneal attachments up to the uterus.  We then cauterized the utero-ovarian pedicle.  We had good hemostasis.  At this point in time, decision was to go vaginally.  The abdomen was deflated with carbon dioxide.  The patient's legs were repositioned.  The Hulka tenaculum was then removed. Weighted speculum was placed in vaginal vault.  Cul-de-sac was entered sharply.  Both uterosacral ligaments were clamped, cut, and suture ligated with 0-Vicryl.  The reflection of vaginal mucosa anteriorly was incised and the bladder was dissected superiorly.  Paracervical tissue was clamped, cut, suture ligated with 0 Vicryl.  We had some difficulty identifying the vesicouterine space to the fibroid.  We continued sharp dissection and  blunt dissection to push the bladder superiorly.  Next, using the clamp, cut and tie technique, the parametrium was serially separated from the sides of the uterus.  We morcellated the lower cervical stump.  We then went posteriorly and grasped the large posterior wall fibroid.  We began morcellate until that fibroid was removed.  We then went anteriorly, developed the vesicouterine space. We continued to morcellate until the fundus was delivered.  The remaining pedicles were clamped, cut, and main part of the uterus passed off the operative field.  The pedicles were secured with ties of 0 Vicryl.  The posterior vaginal cuff was then closed with running suture 0 chromic.   The vaginal mucosa was reapproximated in midline with interrupted figure-of-eight of 0 Vicryl.  Foley was placed straight drain, clear urine was obtained.  Laparoscope was reintroduced.  Some oozing was noted from the right ovary.  It was brought under control with bipolar.  We identified the left fallopian tube using the EnSeal.  It was removed.  Any bleeding on the left side was brought under control with bipolar along the left pelvic sidewall.  There was a large peritoneal defect on the left side, probably from the fibroid on that side.  The cuff was hemostatically intact.  We thoroughly irrigated the pelvis.  We had good hemostasis. We deflated the abdomen and reinflated and no active bleeding was encountered.  We completely deflated the abdomen.  All trocars were removed.  Subumbilical fascia was closed with 2 figure-of-eights of 0 Vicryl.  Skin closed with interrupted subcuticulars of 4-0 Vicryl. Suprapubic incision was closed with Dermabond.  The Foley was removed.  Cystoscopy was performed.  The bladder was completely intact.  Both ureteral orifices were identified and noted to have jets of clear urine.  The cystoscope was then removed.  Foley was placed back to straight drain.  We had no active vaginal bleeding.  The patient was taken out of the lithotomy position.  Once alert and extubated, transferred to recovery in good condition.  Sponge, instrument, and needle count was correct by circulating nurse x2.     Darlyn Chamber, M.D.     JSM/MEDQ  D:  03/29/2017  T:  03/29/2017  Job:  [id number]

## 2017-03-30 ENCOUNTER — Encounter (HOSPITAL_COMMUNITY): Payer: Self-pay | Admitting: Obstetrics and Gynecology

## 2017-03-30 DIAGNOSIS — D251 Intramural leiomyoma of uterus: Secondary | ICD-10-CM | POA: Diagnosis not present

## 2017-03-30 LAB — CBC
HEMATOCRIT: 29.7 % — AB (ref 36.0–46.0)
HEMOGLOBIN: 10.2 g/dL — AB (ref 12.0–15.0)
MCH: 30.7 pg (ref 26.0–34.0)
MCHC: 34.3 g/dL (ref 30.0–36.0)
MCV: 89.5 fL (ref 78.0–100.0)
Platelets: 231 10*3/uL (ref 150–400)
RBC: 3.32 MIL/uL — ABNORMAL LOW (ref 3.87–5.11)
RDW: 12.6 % (ref 11.5–15.5)
WBC: 12.5 10*3/uL — AB (ref 4.0–10.5)

## 2017-03-30 MED ORDER — OXYCODONE-ACETAMINOPHEN 7.5-325 MG PO TABS: 1.0000 | ORAL_TABLET | ORAL | 0 refills | Status: DC | PRN

## 2017-03-30 NOTE — Discharge Summary (Signed)
NAME:  Claudia Hendrix, Claudia Hendrix                   ACCOUNT NO.:  MEDICAL RECORD NO.:  [id number]  LOCATION:                                 FACILITY:  PHYSICIAN:  Darlyn Chamber, M.D.        DATE OF BIRTH:  DATE OF ADMISSION:  03/29/2017 DATE OF DISCHARGE:  03/30/2017                              DISCHARGE SUMMARY   ADMITTING DIAGNOSIS:  Symptomatic uterine fibroids.  DISCHARGE DIAGNOSIS:  Symptomatic uterine fibroids.  PROCEDURE:  Laparoscopic-assisted vaginal hysterectomy with removal of both fallopian tubes and cystoscopy.  For complete history and physical, please see dictated note.  COURSE IN THE HOSPITAL:  The patient underwent above-noted surgery. Postop, she did have some bleeding from the umbilicus.  This area was reinforced and she did well after that.  The following morning, her hemoglobin was 10.2.  Her abdomen was soft.  Bowel sounds were active. The subumbilical incision was intact with no active bleeding. Suprapubic incision was intact.  The Foley had been discontinued.  She voided without difficulty.  She was tolerating diet, ambulating without difficulty.  In terms of complications, none were encountered during her stay in the hospital.  The patient is discharged home in stable condition.  DISPOSITION:  She is to avoid heavy lifting, vaginal entrance, or driving a car.  She was instructed to call for heavy vaginal bleeding. Any fever should be reported.  Nausea, vomiting should be reported. Excessive pain should be reported.  Also instructed on signs and symptoms of deep venous thrombosis and pulmonary embolus.  Would discharge home on Percocet as needed for pain.  PLAN:  Followup in the office in 1 week.     Darlyn Chamber, M.D.     JSM/MEDQ  D:  03/30/2017  T:  03/30/2017  Job:  [id number]

## 2017-03-30 NOTE — Progress Notes (Signed)
Discharge information reviewed with patient. Recovering from your surgery pamphlet given to pt. Pt verbalizes understanding of pain meds and F/U.

## 2017-03-30 NOTE — Discharge Summary (Signed)
Patient name Tae, Robak DICTATION# [id number] CSN# [id number]  [facility], MD 03/30/2017 8:12 AM

## 2017-03-30 NOTE — Addendum Note (Signed)
Addendum  created 03/30/17 1157 by Catalina Gravel, MD   Intraprocedure Staff edited

## 2017-03-30 NOTE — Progress Notes (Signed)
1 Day Post-Op Procedure(s) (LRB): LAPAROSCOPIC ASSISTED VAGINAL HYSTERECTOMY WITH SALPINGECTOMY (Bilateral) CYSTOSCOPY (N/A)  Subjective: Patient reports tolerating PO and no problems voiding.    Objective: I have reviewed patient's vital signs, intake and output and labs.  General: alert GI: soft, non-tender; bowel sounds normal; no masses,  no organomegaly Vaginal Bleeding: moderate  Assessment: s/p Procedure(s): LAPAROSCOPIC ASSISTED VAGINAL HYSTERECTOMY WITH SALPINGECTOMY (Bilateral) CYSTOSCOPY (N/A): stable  Plan: Discharge home  LOS: 1 day    Francine Hannan S 03/30/2017, 8:08 AM

## 2017-03-30 NOTE — Addendum Note (Signed)
Addendum  created 03/30/17 1748 by Lyndle Herrlich, MD   George Attestations filed

## 2017-05-08 ENCOUNTER — Other Ambulatory Visit: Payer: Self-pay | Admitting: Medical

## 2019-03-20 ENCOUNTER — Other Ambulatory Visit: Payer: Self-pay | Admitting: Obstetrics and Gynecology

## 2019-03-20 DIAGNOSIS — R928 Other abnormal and inconclusive findings on diagnostic imaging of breast: Secondary | ICD-10-CM

## 2019-04-03 ENCOUNTER — Other Ambulatory Visit: Payer: Self-pay

## 2019-04-03 ENCOUNTER — Ambulatory Visit
Admission: RE | Admit: 2019-04-03 | Discharge: 2019-04-03 | Disposition: A | Discharge status: Home / Self Care | Payer: No Typology Code available for payment source | Source: Ambulatory Visit | Attending: Obstetrics and Gynecology | Admitting: Obstetrics and Gynecology

## 2019-04-03 ENCOUNTER — Other Ambulatory Visit: Payer: Self-pay | Admitting: Obstetrics and Gynecology

## 2019-04-03 ENCOUNTER — Ambulatory Visit
Admission: RE | Admit: 2019-04-03 | Discharge: 2019-04-03 | Disposition: A | Discharge status: Home / Self Care | Payer: 59 | Source: Ambulatory Visit | Attending: Obstetrics and Gynecology | Admitting: Obstetrics and Gynecology

## 2019-04-03 DIAGNOSIS — N644 Mastodynia: Secondary | ICD-10-CM

## 2019-04-03 DIAGNOSIS — R928 Other abnormal and inconclusive findings on diagnostic imaging of breast: Secondary | ICD-10-CM

## 2019-04-03 DIAGNOSIS — N6310 Unspecified lump in the right breast, unspecified quadrant: Secondary | ICD-10-CM

## 2019-04-03 LAB — HM MAMMOGRAPHY

## 2019-08-04 ENCOUNTER — Ambulatory Visit: Payer: No Typology Code available for payment source | Admitting: Medical

## 2019-08-04 ENCOUNTER — Other Ambulatory Visit: Payer: Self-pay

## 2019-08-06 ENCOUNTER — Other Ambulatory Visit: Payer: Self-pay

## 2019-08-06 ENCOUNTER — Encounter: Payer: Self-pay | Admitting: Medical

## 2019-08-06 ENCOUNTER — Ambulatory Visit (INDEPENDENT_AMBULATORY_CARE_PROVIDER_SITE_OTHER): Payer: No Typology Code available for payment source | Admitting: Medical

## 2019-08-06 VITALS — BP 130/78 | HR 89 | Ht 59.0 in | Wt 176.4 lb

## 2019-08-06 DIAGNOSIS — J301 Allergic rhinitis due to pollen: Secondary | ICD-10-CM | POA: Diagnosis not present

## 2019-08-06 DIAGNOSIS — M79671 Pain in right foot: Secondary | ICD-10-CM

## 2019-08-06 DIAGNOSIS — J4541 Moderate persistent asthma with (acute) exacerbation: Secondary | ICD-10-CM | POA: Insufficient documentation

## 2019-08-06 DIAGNOSIS — M79672 Pain in left foot: Secondary | ICD-10-CM | POA: Insufficient documentation

## 2019-08-06 DIAGNOSIS — M722 Plantar fascial fibromatosis: Secondary | ICD-10-CM | POA: Insufficient documentation

## 2019-08-06 DIAGNOSIS — E669 Obesity, unspecified: Secondary | ICD-10-CM

## 2019-08-06 MED ORDER — ALBUTEROL SULFATE (2.5 MG/3ML) 0.083% IN NEBU: 2.5000 mg | INHALATION_SOLUTION | Freq: Four times a day (QID) | RESPIRATORY_TRACT | 4 refills | Status: DC | PRN

## 2019-08-06 MED ORDER — MOMETASONE FURO-FORMOTEROL FUM 200-5 MCG/ACT IN AERO: 2.0000 | INHALATION_SPRAY | Freq: Two times a day (BID) | RESPIRATORY_TRACT | 5 refills | Status: DC

## 2019-08-06 MED ORDER — ALBUTEROL SULFATE HFA 108 (90 BASE) MCG/ACT IN AERS: 2.0000 | INHALATION_SPRAY | Freq: Four times a day (QID) | RESPIRATORY_TRACT | 1 refills | Status: DC | PRN

## 2019-08-06 NOTE — Progress Notes (Signed)
Subjective: Chief Complaint  Patient presents with  . Medication Refill    asthma-refills needed    Here for f/u.  Hasn't been here since 2018.  She notes she typically "avoids" doctors offices.    Lately having trouble with asthma.   Wants refills on liquid nebs.  In the last 2 months having to use albuterol inhaler twice daily.  Has neb machine at home.  Its her son's machine.  He doesn't even use it now.  Thus she has taken over it.   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  Nonsmoker.  She denies prior use of preventative inhaler.  She is using Zyrtec at night to help with allergies  She denies chest pain, no palpitations, no history of heart disease in self or family.  She has been having some foot pain, heel pain, pain first thing in the morning and can have pain lasting all day particular on the left foot.  Sometimes gets burning sensation in the left foot.  She sees a chiropractor who advised her to do a ice water bottle massage and to rest.  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  She would like a pill to help with weight loss.  She says she needs something to stop her cravings for sweets   Past Medical History:  Diagnosis Date  . Allergy   . 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 Asthma 05/2013  . Environmental allergies   . Fibroid, uterine   . Wears glasses    Current Outpatient Medications on File Prior to Visit  Medication Sig Dispense Refill  . albuterol (PROVENTIL HFA;VENTOLIN HFA) 108 (90 Base) MCG/ACT inhaler Inhale 2 puffs into the lungs every 6 (six) hours as needed for wheezing. 1 Inhaler 1  . albuterol (PROVENTIL) (2.5 MG/3ML) 0.083% nebulizer solution Take 3 mLs (2.5 mg total) by nebulization every 6 (six) hours as needed for wheezing or shortness of breath. (Patient not taking: Reported on 08/06/2019) 75 mL 0   No current facility-administered medications on file prior to visit.   ROS as in subjective   Objective: BP 130/78   Pulse  89   Ht 4\' 11"  (1.499 m)   Wt 176 lb 6.4 oz (80 kg)   SpO2 98%   BMI 35.63 kg/m   Wt Readings from Last 3 Encounters:  08/06/19 176 lb 6.4 oz (80 kg)  03/29/17 162 lb (73.5 kg)  03/19/17 169 lb 8 oz (76.9 kg)   BP Readings from Last 3 Encounters:  08/06/19 130/78  03/30/17 118/70  03/19/17 131/80   Gen: wd, wn, nad, african [place] female Lungs: Coarse breath sounds with mild expiratory wheezes throughout, no crackles Heart regular rate rhythm, normal S1-S2 no murmurs Pulses 2+ upper and lower extremities No lower extremity edema Tender over left heel, left volar foot in general, flat feet in general, otherwise nontender normal range of motion, no other deformity    Assessment: Encounter Diagnoses  Name Primary?  . Moderate persistent asthma with acute exacerbation Yes  . Foot pain, bilateral   . Plantar fasciitis   . Allergic rhinitis due to pollen, unspecified seasonality   . Obesity, unspecified classification, unspecified obesity type, unspecified whether serious comorbidity present      Plan: Asthma: . Avoid asthma triggers such as pollen, smoke, or cold temperatures for example . You asthma is currently considered moderate persistent asthma. Arlyce Harman inhaled medication 2 puffs twice daily daily for prevention.  This  medication is a Animal nutritionist" medication. . You may use "Albuterol" rescue inhaler 1-2 puffs every 4-6 hours as needed for wheezing, shortness of breath, chest tightness or cough spells.   This is a Financial controller" medication. . You may use "Albuterol" rescue nebulized treatment every 4-6 hours as needed for wheezing, shortness of breath, chest tightness or cough spells.   This is a Financial controller" medication. . Continue Zyrtec tablet daily at bedtime to help reduce allergy symptoms which can trigger asthma symptoms.  . Use this regimen above daily.   Some people need to continue this regimen in the spring and fall, some people may only need to use this regimen  during times of respiratory illness such as colds and flu, but some people with severe asthma need to continue this regimen year round. . Call if you have questions, or call or return if you are not improving on this regimen.  . Plan to recheck 1-2 months  Foot pain, plantar fasciitis Discussed signs and symptoms: Discussed treatment recommendations.  Gave handout with recommendations.  Consider arch supports.  Consider nighttime foot splints.  Obesity-advise she follow-up for physical for additional evaluation and discussion  We will request records from gynecology from recent labs and gynecological screenings done back a few months ago  Ahniya was seen today for medication refill.  Diagnoses and all orders for this visit:  Moderate persistent asthma with acute exacerbation  Foot pain, bilateral  Plantar fasciitis  Allergic rhinitis due to pollen, unspecified seasonality  Obesity, unspecified classification, unspecified obesity type, unspecified whether serious comorbidity present  Other orders -     mometasone-formoterol (DULERA) 200-5 MCG/ACT AERO; Inhale 2 puffs into the lungs in the morning and at bedtime. -     albuterol (PROVENTIL) (2.5 MG/3ML) 0.083% nebulizer solution; Take 3 mLs (2.5 mg total) by nebulization every 6 (six) hours as needed for wheezing or shortness of breath. -     albuterol (VENTOLIN HFA) 108 (90 Base) MCG/ACT inhaler; Inhale 2 puffs into the lungs every 6 (six) hours as needed for wheezing or shortness of breath.   F/u in 1-80mo

## 2019-08-06 NOTE — Patient Instructions (Signed)
Asthma: . Avoid asthma triggers such as pollen, smoke, or cold temperatures for example . You asthma is currently considered moderate persistent asthma. Arlyce Harman inhaled medication 2 puffs twice daily daily for prevention.  This medication is a Animal nutritionist" medication. . You may use "Albuterol" rescue inhaler 1-2 puffs every 4-6 hours as needed for wheezing, shortness of breath, chest tightness or cough spells.   This is a Financial controller" medication. . You may use "Albuterol" rescue nebulized treatment every 4-6 hours as needed for wheezing, shortness of breath, chest tightness or cough spells.   This is a Financial controller" medication. . Continue Zyrtec tablet daily at bedtime to help reduce allergy symptoms which can trigger asthma symptoms.  . Use this regimen above daily.   Some people need to continue this regimen in the spring and fall, some people may only need to use this regimen during times of respiratory illness such as colds and flu, but some people with severe asthma need to continue this regimen year round. . Call if you have questions, or call or return if you are not improving on this regimen.  . Plan to recheck 1-2 months   ASTHMA MEDICATIONS Two types of medications are used in asthma treatment: quick relief (or rescue) medications and controller medications. All patients with persistent asthma should have a short-acting relief medication on hand for treatment of exacerbations and a controller medication for long-term control.    Rescue medications include Albuterol, Proventil, Ventolin, ProAir, Xopenex.  Rescue medications, also called quick-relief or fast-acting medications, work immediately to relieve asthma symptoms when they occur. They're often inhaled directly into the lungs, where they open up the airways and relieve symptoms such as wheezing, coughing, and shortness of breath, often within minutes. But as effective as they are, rescue medications don't have a long-term  effect.  Controller medications include inhaled corticosteroids (ICSs), long-acting beta agonists (LABAs), leukotriene modifiers, anti-IgE medications, and combination products.  Examples include Qvar, Asmanex, Pulmicort, Advair, Symbicort, and Dulera.  Controller medications, also called preventive or maintenance medications, work over a period of time to reduce airway inflammation and help prevent asthma symptoms from occurring. They may be inhaled or swallowed as a pill or liquid.    Asthma, Adult Asthma is a disease of the lungs and can make it hard to breathe. Asthma cannot be cured, but medicine can help control it. Asthma may be started (triggered) by:  Pollen.  Dust.  Animal skin flakes (dander).  Molds.  Foods.  Respiratory infections (colds, flu).  Smoke.  Exercise.  Stress.  Other things that cause allergic reactions or allergies (allergens). HOME CARE   Talk to your doctor about how to manage your attacks at home. This may include:  Using a tool called a peak flow meter.  Having medicine ready to stop the attack.  Take all medicine as told by your doctor.  Wash bed sheets and blankets every week in hot water and put them in the dryer.  Drink enough fluids to keep your pee (urine) clear or pale yellow.  Always be ready to get emergency help. Write down the phone number for your doctor. Keep it where you can easily find it.  Talk about exercise routines with your doctor.  If animal dander is causing your asthma, you may need to find a new home for your pet(s). GET HELP RIGHT AWAY IF:   You have muscle aches.  You cough more.  You have chest pain.  You have thick spit (sputum) that  changes to yellow, green, gray, or bloody.  Medicine does not stop your wheezing.  You have problems breathing.  You have a fever.  Your medicine causes:  A rash.  Itching.  Puffiness (swelling).  Breathing problems. MAKE SURE YOU:   Understand these  instructions.  Will watch your condition.  Will get help right away if you are not doing well or get worse. Document Released: 09/06/2007 Document Revised: 06/12/2011 Document Reviewed: 01/29/2008 [facility] Patient Information 2014 [place], [place].       Recommendations:  Plantar fascitis  Every morning try doing a tennis ball massage with feet on the floor and towel stretch behind the ball of the foot to stretch the plantar fascia  Order plantar fascitis night time 90 degree splints online, through [employer] for example.   They are typically about $25 each  Avoid going barefoot since your feet flatten out without good arch support  I recommend you use arch support shoe INSERTS.   This will help support the plantar fascia and arches  You can use over the counter pain reliever for worse pain    Plantar Fasciitis Plantar fasciitis is a painful foot condition that affects the heel. It occurs when the band of tissue that connects the toes to the heel bone (plantar fascia) becomes irritated. This can happen after exercising too much or doing other repetitive activities (overuse injury). The pain from plantar fasciitis can range from mild irritation to severe pain that makes it difficult for you to walk or move. The pain is usually worse in the morning or after you have been sitting or lying down for a while. What are the causes? This condition may be caused by:  Standing for long periods of time.  Wearing shoes that do not fit.  Doing high-impact activities, including running, aerobics, and ballet.  Being overweight.  Having an abnormal way of walking (gait).  Having tight calf muscles.  Having high arches in your feet.  Starting a new athletic activity.  What are the signs or symptoms? The main symptom of this condition is heel pain. Other symptoms include:  Pain that gets worse after activity or exercise.  Pain that is worse in the morning or after resting.  Pain  that goes away after you walk for a few minutes.  How is this diagnosed? This condition may be diagnosed based on your signs and symptoms. Your health care provider will also do a physical exam to check for:  A tender area on the bottom of your foot.  A high arch in your foot.  Pain when you move your foot.  Difficulty moving your foot.  You may also need to have imaging studies to confirm the diagnosis. These can include:  X-rays.  Ultrasound.  MRI.  How is this treated? Treatment for plantar fasciitis depends on the severity of the condition. Your treatment may include:  Rest, ice, and over-the-counter pain medicines to manage your pain.  Exercises to stretch your calves and your plantar fascia.  A splint that holds your foot in a stretched, upward position while you sleep (night splint).  Physical therapy to relieve symptoms and prevent problems in the future.  Cortisone injections to relieve severe pain.  Extracorporeal shock wave therapy (ESWT) to stimulate damaged plantar fascia with electrical impulses. It is often used as a last resort before surgery.  Surgery, if other treatments have not worked after 12 months.  Follow these instructions at home:  Take medicines only as directed by your  health care provider.  Avoid activities that cause pain.  Roll the bottom of your foot over a bag of ice or a bottle of cold water. Do this for 20 minutes, 3-4 times a day.  Perform simple stretches as directed by your health care provider.  Try wearing athletic shoes with air-sole or gel-sole cushions or soft shoe inserts.  Wear a night splint while sleeping, if directed by your health care provider.  Keep all follow-up appointments with your health care provider. How is this prevented?  Do not perform exercises or activities that cause heel pain.  Consider finding low-impact activities if you continue to have problems.  Lose weight if you need to. The best way to  prevent plantar fasciitis is to avoid the activities that aggravate your plantar fascia. Contact a health care provider if:  Your symptoms do not go away after treatment with home care measures.  Your pain gets worse.  Your pain affects your ability to move or do your daily activities. This information is not intended to replace advice given to you by your health care provider. Make sure you discuss any questions you have with your health care provider. Document Released: 12/13/2000 Document Revised: 08/23/2015 Document Reviewed: 01/28/2014 Elsevier Interactive Patient Education  [employer].

## 2019-08-12 ENCOUNTER — Telehealth: Payer: Self-pay | Admitting: Medical

## 2019-08-12 ENCOUNTER — Other Ambulatory Visit: Payer: Self-pay | Admitting: Medical

## 2019-08-12 MED ORDER — FLUTICASONE-SALMETEROL 100-50 MCG/DOSE IN AEPB: 1.0000 | INHALATION_SPRAY | Freq: Two times a day (BID) | RESPIRATORY_TRACT | 1 refills | Status: DC

## 2019-08-12 NOTE — Telephone Encounter (Signed)
I sent Advair as an option instead of Symbicort or Dulera.  These were all preventative type daily medications.

## 2019-08-12 NOTE — Telephone Encounter (Signed)
Pt advised.

## 2019-08-12 NOTE — Telephone Encounter (Signed)
Pt called and said [facility] on [address] doesn't have the [facility] or Symbicort in stock and wants to see if another inhaler can be called in that may be in stock.

## 2019-08-13 ENCOUNTER — Telehealth: Payer: Self-pay

## 2019-08-13 ENCOUNTER — Other Ambulatory Visit: Payer: Self-pay | Admitting: Medical

## 2019-08-13 MED ORDER — BUDESONIDE 180 MCG/ACT IN AEPB: 1.0000 | INHALATION_SPRAY | Freq: Two times a day (BID) | RESPIRATORY_TRACT | 0 refills | Status: DC

## 2019-08-13 NOTE — Telephone Encounter (Signed)
Pulmicort sent as alternate

## 2019-08-13 NOTE — Telephone Encounter (Signed)
Pt. Called stating the inhaler you called in for her is 84 dollars too expensive for her.The pharmacists told her that Pulmicort would be more affordable around 40 dollars.

## 2019-08-13 NOTE — Telephone Encounter (Signed)
Pt. Aware the Pulmicort has been sent in.

## 2019-08-15 ENCOUNTER — Telehealth: Payer: Self-pay | Admitting: Medical

## 2019-08-15 NOTE — Telephone Encounter (Signed)
Received requested records from [facility]  

## 2019-08-18 ENCOUNTER — Encounter: Payer: Self-pay | Admitting: Medical

## 2019-08-20 ENCOUNTER — Encounter: Payer: Self-pay | Admitting: Medical

## 2019-08-27 ENCOUNTER — Encounter: Payer: Self-pay | Admitting: Medical

## 2019-10-03 ENCOUNTER — Other Ambulatory Visit: Payer: Self-pay | Admitting: Obstetrics and Gynecology

## 2019-10-03 ENCOUNTER — Other Ambulatory Visit: Payer: Self-pay

## 2019-10-03 ENCOUNTER — Ambulatory Visit
Admission: RE | Admit: 2019-10-03 | Discharge: 2019-10-03 | Disposition: A | Discharge status: Home / Self Care | Payer: No Typology Code available for payment source | Source: Ambulatory Visit | Attending: Obstetrics and Gynecology | Admitting: Obstetrics and Gynecology

## 2019-10-03 DIAGNOSIS — N6310 Unspecified lump in the right breast, unspecified quadrant: Secondary | ICD-10-CM

## 2019-10-08 ENCOUNTER — Ambulatory Visit: Payer: No Typology Code available for payment source | Admitting: Medical

## 2019-12-15 ENCOUNTER — Other Ambulatory Visit: Payer: 59

## 2019-12-15 DIAGNOSIS — Z20822 Contact with and (suspected) exposure to covid-19: Secondary | ICD-10-CM

## 2019-12-17 LAB — SPECIMEN STATUS REPORT

## 2019-12-17 LAB — NOVEL CORONAVIRUS, NAA: SARS-CoV-2, NAA: NOT DETECTED

## 2019-12-17 LAB — SARS-COV-2, NAA 2 DAY TAT

## 2020-03-19 ENCOUNTER — Other Ambulatory Visit: Payer: Self-pay

## 2020-03-19 ENCOUNTER — Ambulatory Visit
Admission: RE | Admit: 2020-03-19 | Discharge: 2020-03-19 | Disposition: A | Discharge status: Home / Self Care | Payer: No Typology Code available for payment source | Source: Ambulatory Visit | Attending: Medical | Admitting: Medical

## 2020-03-19 ENCOUNTER — Encounter: Payer: Self-pay | Admitting: Medical

## 2020-03-19 ENCOUNTER — Ambulatory Visit (INDEPENDENT_AMBULATORY_CARE_PROVIDER_SITE_OTHER): Payer: No Typology Code available for payment source | Admitting: Medical

## 2020-03-19 VITALS — BP 134/78 | HR 83 | Ht 59.0 in | Wt 175.8 lb

## 2020-03-19 DIAGNOSIS — M542 Cervicalgia: Secondary | ICD-10-CM | POA: Diagnosis not present

## 2020-03-19 DIAGNOSIS — R200 Anesthesia of skin: Secondary | ICD-10-CM

## 2020-03-19 DIAGNOSIS — M5410 Radiculopathy, site unspecified: Secondary | ICD-10-CM | POA: Diagnosis not present

## 2020-03-19 MED ORDER — PREDNISONE 10 MG PO TABS: ORAL_TABLET | ORAL | 0 refills | Status: DC

## 2020-03-19 NOTE — Patient Instructions (Signed)
Please go to [facility] for your neck xray.   Their hours are 8am - 4:30 pm Monday - Friday.  Take your insurance card with you.  [facility] [phone]  [address], [address] [place], [place] [address]  [address] [place], [place] [address] 

## 2020-03-19 NOTE — Progress Notes (Signed)
Subjective:  Claudia Hendrix is a 44 y.o. female who presents for Chief Complaint  Patient presents with  . Numbness    Numbness and tingling in fingers-left hand is worse      Here today for numbness and tingling in both arms.  Her main concern is tingling and numbness in the hands.  She wakes up every morning having the shake her hands awake.  This is been going on for probably a year.  This has gotten worse lately.  Worse in the left hand compared to the right.  She is right-handed.  No injury no fall no trauma.  She has chronic neck pain.  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  Chiropractor recommended she come in for evaluation here  She denies history of anemia, no fever, no history of vitamin deficiency, not pregnant. no other specific symptoms today.  No family history of rheumatoid arthritis.  She denies any personal history of vitamin deficiency, no history of thyroid issues, no heavy alcohol intake  No other aggravating or relieving factors. No other complaint.  No other c/o.  The following portions of the patient's history were reviewed and updated as appropriate: allergies, current medications, past family history, past medical history, past social history, past surgical history and problem list.  ROS Otherwise as in subjective above    Objective: BP 134/78   Pulse 83   Ht 4\' 11"  (1.499 m)   Wt 175 lb 12.8 oz (79.7 kg)   SpO2 97%   BMI 35.51 kg/m   General appearance: alert, no distress, well developed, well nourished Neck: supple, nontender but mild pain with range of motion particularly left and right rotation.  Range of motion relatively full though.  No lymphadenopathy, no thyromegaly, no masses Arms without deformity no swelling no bony changes. Back nontender Positive Tinel's bilaterally but negative Phalen's.  Arms otherwise neurovascularly intact Pulses: 2+ radial pulses, 2+ pedal pulses, normal cap refill Ext: no  edema   Assessment: Encounter Diagnoses  Name Primary?  . Arm numbness Yes  . Neck pain   . Radicular pain of upper extremity      Plan: We discussed symptoms.  We discussed differential.  I suspect radicular issue coming from the neck.  Begin round of prednisone.  Go for x-ray.  We discussed gentle stretching.  She declines any other pain medicine at this time. We discussed that we would likely need to do a orthopedic consult and/or nerve conduction studies assuming a normal neck x-ray.  We discussed other differential which is less likely such as vitamin deficiency, thyroid issue, other.  She does have a physical soon planned and we will do labs at that time depending on what gynecology does at her upcoming gynecology visit as well.  [name] was seen today for numbness.  Diagnoses and all orders for this visit:  Arm numbness -     DG Cervical Spine Complete; Future  Neck pain -     DG Cervical Spine Complete; Future  Radicular pain of upper extremity  Other orders -     predniSONE (DELTASONE) 10 MG tablet; 6/5/4/3/2/1 taper    Follow up: Pending x-ray

## 2020-04-05 ENCOUNTER — Other Ambulatory Visit: Payer: 59

## 2020-04-06 ENCOUNTER — Other Ambulatory Visit: Payer: Self-pay

## 2020-04-06 ENCOUNTER — Ambulatory Visit
Admission: RE | Admit: 2020-04-06 | Discharge: 2020-04-06 | Disposition: A | Discharge status: Home / Self Care | Payer: No Typology Code available for payment source | Source: Ambulatory Visit | Attending: Obstetrics and Gynecology | Admitting: Obstetrics and Gynecology

## 2020-04-06 ENCOUNTER — Ambulatory Visit
Admission: RE | Admit: 2020-04-06 | Discharge: 2020-04-06 | Disposition: A | Discharge status: Home / Self Care | Payer: 59 | Source: Ambulatory Visit | Attending: Obstetrics and Gynecology | Admitting: Obstetrics and Gynecology

## 2020-04-06 DIAGNOSIS — N6310 Unspecified lump in the right breast, unspecified quadrant: Secondary | ICD-10-CM

## 2020-04-06 LAB — HM MAMMOGRAPHY

## 2020-04-23 ENCOUNTER — Other Ambulatory Visit (HOSPITAL_COMMUNITY): Payer: Self-pay | Admitting: Obstetrics and Gynecology

## 2020-04-23 ENCOUNTER — Other Ambulatory Visit: Payer: Self-pay | Admitting: Obstetrics and Gynecology

## 2020-04-23 DIAGNOSIS — R0989 Other specified symptoms and signs involving the circulatory and respiratory systems: Secondary | ICD-10-CM

## 2020-04-23 DIAGNOSIS — Z9189 Other specified personal risk factors, not elsewhere classified: Secondary | ICD-10-CM

## 2020-04-23 LAB — HM PAP SMEAR: HM Pap smear: NEGATIVE

## 2020-04-23 LAB — RESULTS CONSOLE HPV: CHL HPV: NEGATIVE

## 2020-05-04 ENCOUNTER — Ambulatory Visit (HOSPITAL_COMMUNITY)
Admission: RE | Admit: 2020-05-04 | Discharge: 2020-05-04 | Disposition: A | Discharge status: Home / Self Care | Payer: No Typology Code available for payment source | Source: Ambulatory Visit | Attending: Obstetrics and Gynecology | Admitting: Obstetrics and Gynecology

## 2020-05-04 ENCOUNTER — Other Ambulatory Visit: Payer: Self-pay

## 2020-05-04 DIAGNOSIS — R0989 Other specified symptoms and signs involving the circulatory and respiratory systems: Secondary | ICD-10-CM | POA: Diagnosis present

## 2020-06-28 ENCOUNTER — Telehealth: Payer: Self-pay

## 2020-06-28 NOTE — Telephone Encounter (Signed)
Pt. Called wanting to know if she could get some blood work done for hair loss. She went to [facility] on [address] and they recommend she get Vitamin B 1, 3, 5, and 6 for hair loss. I told her I would have get the ok first before scheduling that.

## 2020-06-28 NOTE — Telephone Encounter (Signed)
I don't think I have seen her for a physical.  So lets schedule for fasting physical including some labs she was wanting.   More than likely labs will be cheaper as part of a wellness visit.

## 2020-06-29 NOTE — Telephone Encounter (Signed)
Called pt. LM to schedule CPE with fasting labs so these labs may be covered.

## 2020-08-11 ENCOUNTER — Encounter: Payer: Self-pay | Admitting: Medical

## 2020-08-11 ENCOUNTER — Other Ambulatory Visit: Payer: Self-pay

## 2020-08-11 ENCOUNTER — Ambulatory Visit (INDEPENDENT_AMBULATORY_CARE_PROVIDER_SITE_OTHER): Payer: No Typology Code available for payment source | Admitting: Medical

## 2020-08-11 VITALS — BP 110/80 | HR 85 | Ht 59.0 in | Wt 173.0 lb

## 2020-08-11 DIAGNOSIS — Z131 Encounter for screening for diabetes mellitus: Secondary | ICD-10-CM | POA: Insufficient documentation

## 2020-08-11 DIAGNOSIS — Z9851 Tubal ligation status: Secondary | ICD-10-CM | POA: Diagnosis not present

## 2020-08-11 DIAGNOSIS — Z7185 Encounter for immunization safety counseling: Secondary | ICD-10-CM

## 2020-08-11 DIAGNOSIS — Z0000 Encounter for general adult medical examination without abnormal findings: Secondary | ICD-10-CM

## 2020-08-11 DIAGNOSIS — E669 Obesity, unspecified: Secondary | ICD-10-CM

## 2020-08-11 DIAGNOSIS — Z6834 Body mass index (BMI) 34.0-34.9, adult: Secondary | ICD-10-CM

## 2020-08-11 DIAGNOSIS — R202 Paresthesia of skin: Secondary | ICD-10-CM

## 2020-08-11 DIAGNOSIS — Z2820 Immunization not carried out because of patient decision for unspecified reason: Secondary | ICD-10-CM | POA: Diagnosis not present

## 2020-08-11 DIAGNOSIS — Z90710 Acquired absence of both cervix and uterus: Secondary | ICD-10-CM

## 2020-08-11 DIAGNOSIS — J301 Allergic rhinitis due to pollen: Secondary | ICD-10-CM

## 2020-08-11 DIAGNOSIS — J4520 Mild intermittent asthma, uncomplicated: Secondary | ICD-10-CM | POA: Insufficient documentation

## 2020-08-11 DIAGNOSIS — E559 Vitamin D deficiency, unspecified: Secondary | ICD-10-CM

## 2020-08-11 NOTE — Progress Notes (Signed)
Subjective:   HPI  Claudia Hendrix is a 45 y.o. female who presents for Chief Complaint  Patient presents with  . Annual Exam    CPE with fasting labs     Patient Care Team: Leonard Hendler, Camelia Eng, PA-C as PCP - General (Family Medicine) Sees dentist Sees eye doctor  Concerns: Has some tooth ache, seeing dentist soon  Has ongoing numbness in hands, no recent injury.  Has some occasional neck pain.  She notes having labs with gynecology a few months ago including normal TSH, low vitamin D.  Cholesterol was normal except for low HDL.   Reviewed their medical, surgical, family, social, medication, and allergy history and updated chart as appropriate.   Review of Systems Constitutional: -fever, -chills, -sweats, -unexpected weight change, -decreased appetite, -fatigue Allergy: -sneezing, -itching, -congestion Dermatology: -changing moles, --rash, -lumps ENT: -runny nose, -ear pain, -sore throat, -hoarseness, -sinus pain, -teeth pain, - ringing in ears, -hearing loss, -nosebleeds Cardiology: -chest pain, -palpitations, -swelling, -difficulty breathing when lying flat, -waking up short of breath Respiratory: -cough, -shortness of breath, -difficulty breathing with exercise or exertion, -wheezing, -coughing up blood Gastroenterology: -abdominal pain, -nausea, -vomiting, -diarrhea, -constipation, -blood in stool, -changes in bowel movement, -difficulty swallowing or eating Hematology: -bleeding, -bruising  Musculoskeletal: -joint aches, +muscle aches, -joint swelling, -back pain, +neck pain, -cramping, -changes in gait Ophthalmology: denies vision changes, eye redness, itching, discharge Urology: -burning with urination, -difficulty urinating, -blood in urine, -urinary frequency, -urgency, -incontinence Neurology: -headache, -weakness, +tingling, +numbness, -memory loss, -falls, -dizziness Psychology: -depressed mood, -agitation, -sleep problems Breast/gyn: -breast tendnerss, -discharge,  -lumps, -vaginal discharge,- irregular periods, -heavy periods     Objective:  BP 110/80   Pulse 85   Ht 4\' 11"  (1.499 m)   Wt 173 lb (78.5 kg)   LMP 03/29/2017   SpO2 98%   BMI 34.94 kg/m   General appearance: alert, no distress, WD/WN, African American female Skin: no worrisome lesions HEENT: normocephalic, conjunctiva/corneas normal, sclerae anicteric, PERRLA, EOMi, nares patent, no discharge or erythema, pharynx normal Neck: supple, no lymphadenopathy, no thyromegaly, no masses, normal ROM, no bruits Chest: non tender, normal shape and expansion Heart: RRR, normal S1, S2, no murmurs Lungs: CTA bilaterally, no wheezes, rhonchi, or rales Abdomen: +bs, soft, non tender, non distended, no masses, no hepatomegaly, no splenomegaly, no bruits Back: non tender, normal ROM, no scoliosis Musculoskeletal: upper extremities non tender, no obvious deformity, normal ROM throughout, lower extremities non tender, no obvious deformity, normal ROM throughout Extremities: no edema, no cyanosis, no clubbing Pulses: 2+ symmetric, upper and lower extremities, normal cap refill Neurological:-phalens and tinels,  alert, oriented x 3, CN2-12 intact, strength normal upper extremities and lower extremities, sensation normal throughout, DTRs 2+ throughout, no cerebellar signs, gait normal Psychiatric: normal affect, behavior normal, pleasant  Breast/gyn/rectal - deferred to gynecology    Assessment and Plan :   Encounter Diagnoses  Name Primary?  . Encounter for health maintenance examination in adult Yes  . Vaccine counseling   . Vaccine refused by patient   . S/P tubal ligation   . S/P laparoscopic assisted vaginal hysterectomy (LAVH)   . Class 1 obesity with serious comorbidity and body mass index (BMI) of 34.0 to 34.9 in adult, unspecified obesity type   . Allergic rhinitis due to pollen, unspecified seasonality   . Mild intermittent asthma without complication   . Vitamin D deficiency   .  Screening for diabetes mellitus   . Paresthesia of arm     Today you  had a preventative care visit or wellness visit.    Topics today may have included healthy lifestyle, diet, exercise, preventative care, vaccinations, sick and well care, proper use of emergency dept and after hours care, as well as other concerns.     Recommendations: Continue to return yearly for your annual wellness and preventative care visits.  This gives [place] a chance to discuss healthy lifestyle, exercise, vaccinations, review your chart record, and perform screenings where appropriate.  I recommend you see your eye doctor yearly for routine vision care.  I recommend you see your dentist yearly for routine dental care including hygiene visits twice yearly.   Vaccination recommendations were reviewed  She declines flu, covid,  and pneumococcal vaccines   Screening for cancer: Breast cancer screening: You should perform a self breast exam monthly.   We reviewed recommendations for regular mammograms and breast cancer screening.  Colon cancer screening:  Age 21  Cervical cancer screening: We reviewed recommendations for pap smear screening.  S/p hysterectomy  Skin cancer screening: Check your skin regularly for new changes, growing lesions, or other lesions of concern Come in for evaluation if you have skin lesions of concern.  Lung cancer screening: If you have a greater than 30 pack year history of tobacco use, then you qualify for lung cancer screening with a chest CT scan  We currently don't have screenings for other cancers besides breast, cervical, colon, and lung cancers.  If you have a strong family history of cancer or have other cancer screening concerns, please let me know.    Bone health: Get at least 150 minutes of aerobic exercise weekly Get weight bearing exercise at least once weekly    Heart health: Get at least 150 minutes of aerobic exercise weekly Limit alcohol It is important  to maintain a healthy blood pressure and healthy cholesterol numbers    Separate significant issues discussed: We will try and get records from gynecology from where she had labs done a few months ago.  She notes normal thyroid, low vitamin D, normal cholesterol levels except for HDL.  Asthma-mild intermittent.  Continue albuterol as needed.  PFTs reviewed today  Allergic rhinitis-no current complaints  Vitamin D deficiency-on supplement  BMI greater than 34- recommended weight loss through healthy lifestyle, healthy diet and exercise  Upper back pain, spasm-advised regular stretching, consider massage therapy or hand-held massager at home.   Sharnise was seen today for annual exam.  Diagnoses and all orders for this visit:  Encounter for health maintenance examination in adult -     Comprehensive metabolic panel -     CBC -     Hemoglobin A1c  Vaccine counseling  Vaccine refused by patient  S/P tubal ligation  S/P laparoscopic assisted vaginal hysterectomy (LAVH)  Class 1 obesity with serious comorbidity and body mass index (BMI) of 34.0 to 34.9 in adult, unspecified obesity type -     Hemoglobin A1c  Allergic rhinitis due to pollen, unspecified seasonality  Mild intermittent asthma without complication -     Spirometry with Graph  Vitamin D deficiency  Screening for diabetes mellitus -     Hemoglobin A1c  Paresthesia of arm     Follow-up pending labs, yearly for physical

## 2020-08-12 LAB — COMPREHENSIVE METABOLIC PANEL
ALT: 15 IU/L (ref 0–32)
AST: 19 IU/L (ref 0–40)
Albumin/Globulin Ratio: 1.6 (ref 1.2–2.2)
Albumin: 4.4 g/dL (ref 3.8–4.8)
Alkaline Phosphatase: 65 IU/L (ref 44–121)
BUN/Creatinine Ratio: 13 (ref 9–23)
BUN: 10 mg/dL (ref 6–24)
Bilirubin Total: 0.6 mg/dL (ref 0.0–1.2)
CO2: 22 mmol/L (ref 20–29)
Calcium: 9 mg/dL (ref 8.7–10.2)
Chloride: 103 mmol/L (ref 96–106)
Creatinine, Ser: 0.78 mg/dL (ref 0.57–1.00)
Globulin, Total: 2.7 g/dL (ref 1.5–4.5)
Glucose: 83 mg/dL (ref 65–99)
Potassium: 3.7 mmol/L (ref 3.5–5.2)
Sodium: 139 mmol/L (ref 134–144)
Total Protein: 7.1 g/dL (ref 6.0–8.5)
eGFR: 97 mL/min/{1.73_m2} (ref >59)

## 2020-08-12 LAB — CBC
Hematocrit: 39.1 % (ref 34.0–46.6)
Hemoglobin: 13.3 g/dL (ref 11.1–15.9)
MCH: 30.8 pg (ref 26.6–33.0)
MCHC: 34 g/dL (ref 31.5–35.7)
MCV: 91 fL (ref 79–97)
Platelets: 265 10*3/uL (ref 150–450)
RBC: 4.32 x10E6/uL (ref 3.77–5.28)
RDW: 12 % (ref 11.7–15.4)
WBC: 10.2 10*3/uL (ref 3.4–10.8)

## 2020-08-12 LAB — HEMOGLOBIN A1C
Est. average glucose Bld gHb Est-mCnc: 97 mg/dL
Hgb A1c MFr Bld: 5 % (ref 4.8–5.6)

## 2020-08-26 ENCOUNTER — Telehealth: Payer: Self-pay | Admitting: Medical

## 2020-08-26 NOTE — Telephone Encounter (Signed)
Called and left message for pt

## 2020-08-26 NOTE — Telephone Encounter (Signed)
Emetrol OTC works fine.   If needed we can send Zofran

## 2020-08-26 NOTE — Telephone Encounter (Signed)
Pt called and states that she has had covid since last Sunday and all of her symptoms have basically went away except the nausea she is wanting to know if there is something over the counter that she can take

## 2020-09-08 ENCOUNTER — Telehealth: Payer: Self-pay | Admitting: Medical

## 2020-09-08 NOTE — Telephone Encounter (Signed)
Requested records received from [facility]

## 2020-09-09 ENCOUNTER — Institutional Professional Consult (permissible substitution): Payer: No Typology Code available for payment source | Admitting: Medical

## 2020-09-17 ENCOUNTER — Encounter: Payer: Self-pay | Admitting: Medical

## 2020-09-28 ENCOUNTER — Encounter: Payer: Self-pay | Admitting: Medical

## 2020-09-29 ENCOUNTER — Encounter: Payer: Self-pay | Admitting: Medical

## 2020-11-04 ENCOUNTER — Ambulatory Visit (INDEPENDENT_AMBULATORY_CARE_PROVIDER_SITE_OTHER): Payer: No Typology Code available for payment source | Admitting: Medical

## 2020-11-04 ENCOUNTER — Other Ambulatory Visit: Payer: Self-pay

## 2020-11-04 VITALS — BP 130/80 | HR 86 | Temp 98.4°F | Wt 173.2 lb

## 2020-11-04 DIAGNOSIS — L509 Urticaria, unspecified: Secondary | ICD-10-CM | POA: Diagnosis not present

## 2020-11-04 DIAGNOSIS — W57XXXA Bitten or stung by nonvenomous insect and other nonvenomous arthropods, initial encounter: Secondary | ICD-10-CM | POA: Diagnosis not present

## 2020-11-04 MED ORDER — TRIAMCINOLONE ACETONIDE 0.1 % EX CREA: 1.0000 "application " | TOPICAL_CREAM | Freq: Two times a day (BID) | CUTANEOUS | 0 refills | Status: DC

## 2020-11-04 NOTE — Progress Notes (Signed)
Subjective:  Claudia Hendrix is a 45 y.o. female who presents for Chief Complaint  Patient presents with   bite on leg    Bite on back of left leg. Was small spot yesterday and size of like tennis ball now. Very itchy and sore     Here for bug bite and rash.  Yesterday she was cleaning a house and felt something possibly bite the back of her thigh.  She never saw the insect or object.  Later in the day started getting pink-red itchy spot and a welt.  No fever no body aches or chills.  Otherwise feels fine.  No other aggravating or relieving factors.    No other c/o.  The following portions of the patient's history were reviewed and updated as appropriate: allergies, current medications, past family history, past medical history, past social history, past surgical history and problem list.  ROS Otherwise as in subjective above  Objective: BP 130/80   Pulse 86   Temp 98.4 F (36.9 C)   Wt 173 lb 3.2 oz (78.6 kg)   LMP 03/29/2017   SpO2 98%   BMI 34.98 kg/m   General appearance: alert, no distress, well developed, well nourished Left posterior medial thigh mid leg with 6 cm round pink urticarial lesion.  No other lesion.  No erythema no fluctuance no warmth nontender    Assessment: Encounter Diagnoses  Name Primary?   Urticaria Yes   Bug bite, initial encounter      Plan: We discussed possible insects that would have caused this type of rash.  Begin Benadryl over-the-counter as discussed for the next several days, caution with drowsiness.  Practice good hygiene.  Begin cream below for the rash.  We discussed signs of infection that would prompt recheck.  Otherwise I would expect this to calm down and resolve over the next 4 to 5 days.  Patient Instructions  Hives/urticaria, likely due to insect bite Begin Benadryl/Diphenhydramine '25mg'$  in the evening, or up to 12.5 - '25mg'$  twice daily the next few days.   Caution - this can cause drowsiness.   Continue benadryl the next 3-4  days Begin steroid cream triamcinolone to the bite/rash area twice daily for up to a week If worse or not irmproving in the next 2-3 days, then call back If signs of infection, such as hardness, red, hot to touch, pain, then call back or recheck   Insect Bite, Adult An insect bite can make your skin red, itchy, and swollen. Some insects can spread disease to people with a bite. However, most insect bites do not lead todisease, and most are not serious. What are the causes? Insects may bite for many reasons, including: Hunger. To defend themselves. Insects that bite include: Spiders. Mosquitoes. Ticks. Fleas. Ants. Flies. Kissing bugs. Chiggers. What are the signs or symptoms? Symptoms of this condition include: Itching or pain in the bite area. Redness and swelling in the bite area. An open wound (skin ulcer). Symptoms often last for 2-4 days. In rare cases, a person may have a very bad allergic reaction (anaphylactic reaction) to a bite. Symptoms of an anaphylactic reaction may include: Feeling warm in the face (flushed). Your face may turn red. Itchy, red, swollen areas of skin (hives). Swelling of the: Eyes. Lips. Face. Mouth. Tongue. Throat. Trouble with any of these: Breathing. Talking. Swallowing. Loud breathing (wheezing). Feeling dizzy or light-headed. Passing out (fainting). Pain or cramps in your belly. Throwing up (vomiting). Watery poop (diarrhea). How is this  treated? Treatment is usually not needed. Symptoms often go away on their own. When treatment is needed, it may involve: Putting a cream or lotion on the bite area. This helps with itching. Taking an antibiotic medicine. This treatment is needed if the bite area gets infected. Getting a tetanus shot, if you are not up to date on this vaccine. Putting ice on the affected area. Using medicines called antihistamines. This treatment may be needed if you have itching or an allergic reaction to the  insect bite. Giving yourself a shot of medicine (epinephrine) using an auto-injector "pen" if you have an anaphylactic reaction to a bite. Your doctor will teach you how to use this pen. Follow these instructions at home: Bite area care  Do not scratch the bite area. Keep the bite area clean and dry. Wash the bite area every day with soap and water as told by your doctor. Check the bite area every day for signs of infection. Check for: Redness, swelling, or pain. Fluid or blood. Warmth. Pus or a bad smell.  Managing pain, itching, and swelling  You may put any of these on the bite area as told by your doctor: A paste made of baking soda and water. Cortisone cream. Calamine lotion. If told, put ice on the bite area. Put ice in a plastic bag. Place a towel between your skin and the bag. Leave the ice on for 20 minutes, 2-3 times a day.  General instructions Apply or take over-the-counter and prescription medicines only as told by your doctor. If you were prescribed an antibiotic medicine, take or apply it as told by your doctor. Do not stop using the antibiotic even if your condition improves. Keep all follow-up visits as told by your doctor. This is important. How is this prevented? To help you have a lower risk of insect bites: When you are outside, wear clothing that covers your arms and legs. Use insect repellent. The best insect repellents contain one of these: DEET. Picaridin. Oil of lemon eucalyptus (OLE). [id number]. Consider spraying your clothing with a pesticide called permethrin. Permethrin helps prevent insect bites. It works for several weeks and for up to 5-6 clothing washes. Do not apply permethrin directly to the skin. If your home windows do not have screens, think about putting some in. If you will be sleeping in an area where there are mosquitoes, consider covering your sleeping area with a mosquito net. Contact a doctor if: You have redness, swelling, or pain  in the bite area. You have fluid or blood coming from the bite area. The bite area feels warm to the touch. You have pus or a bad smell coming from the bite area. You have a fever. Get help right away if: You have joint pain. You have a rash. You feel more tired or sleepy than you normally do. You have neck pain. You have a headache. You feel weaker than you normally do. You have signs of an anaphylactic reaction. Signs may include: Feeling warm in the face. Itchy, red, swollen areas of skin. Swelling of your: Eyes. Lips. Face. Mouth. Tongue. Throat. Trouble with any of these: Breathing. Talking. Swallowing. Loud breathing. Feeling dizzy or light-headed. Passing out. Pain or cramps in your belly. Throwing up. Watery poop. These symptoms may be an emergency. Do not wait to see if the symptoms will go away. Do this right away: Use your auto-injector pen as you have been told. Get medical help. Call your local emergency services (911 in  the [place] Do not drive yourself to the hospital. Summary An insect bite can make your skin red, itchy, and swollen. Treatment is usually not needed. Symptoms often go away on their own. Do not scratch the bite area. Keep it clean and dry. Ice can help with pain and itching from the bite. This information is not intended to replace advice given to you by your health care provider. Make sure you discuss any questions you have with your healthcare provider. Document Revised: 02/10/2020 Document Reviewed: 09/28/2017 Elsevier Patient Education  2022 [place] was seen today for bite on leg.  Diagnoses and all orders for this visit:  Urticaria  Bug bite, initial encounter  Other orders -     triamcinolone cream (KENALOG) 0.1 %; Apply 1 application topically 2 (two) times daily.   Follow up: prn

## 2020-11-04 NOTE — Patient Instructions (Addendum)
Hives/urticaria, likely due to insect bite Begin Benadryl/Diphenhydramine '25mg'$  in the evening, or up to 12.5 - '25mg'$  twice daily the next few days.   Caution - this can cause drowsiness.   Continue benadryl the next 3-4 days Begin steroid cream triamcinolone to the bite/rash area twice daily for up to a week If worse or not irmproving in the next 2-3 days, then call back If signs of infection, such as hardness, red, hot to touch, pain, then call back or recheck   Insect Bite, Adult An insect bite can make your skin red, itchy, and swollen. Some insects can spread disease to people with a bite. However, most insect bites do not lead todisease, and most are not serious. What are the causes? Insects may bite for many reasons, including: Hunger. To defend themselves. Insects that bite include: Spiders. Mosquitoes. Ticks. Fleas. Ants. Flies. Kissing bugs. Chiggers. What are the signs or symptoms? Symptoms of this condition include: Itching or pain in the bite area. Redness and swelling in the bite area. An open wound (skin ulcer). Symptoms often last for 2-4 days. In rare cases, a person may have a very bad allergic reaction (anaphylactic reaction) to a bite. Symptoms of an anaphylactic reaction may include: Feeling warm in the face (flushed). Your face may turn red. Itchy, red, swollen areas of skin (hives). Swelling of the: Eyes. Lips. Face. Mouth. Tongue. Throat. Trouble with any of these: Breathing. Talking. Swallowing. Loud breathing (wheezing). Feeling dizzy or light-headed. Passing out (fainting). Pain or cramps in your belly. Throwing up (vomiting). Watery poop (diarrhea). How is this treated? Treatment is usually not needed. Symptoms often go away on their own. When treatment is needed, it may involve: Putting a cream or lotion on the bite area. This helps with itching. Taking an antibiotic medicine. This treatment is needed if the bite area gets  infected. Getting a tetanus shot, if you are not up to date on this vaccine. Putting ice on the affected area. Using medicines called antihistamines. This treatment may be needed if you have itching or an allergic reaction to the insect bite. Giving yourself a shot of medicine (epinephrine) using an auto-injector "pen" if you have an anaphylactic reaction to a bite. Your doctor will teach you how to use this pen. Follow these instructions at home: Bite area care  Do not scratch the bite area. Keep the bite area clean and dry. Wash the bite area every day with soap and water as told by your doctor. Check the bite area every day for signs of infection. Check for: Redness, swelling, or pain. Fluid or blood. Warmth. Pus or a bad smell.  Managing pain, itching, and swelling  You may put any of these on the bite area as told by your doctor: A paste made of baking soda and water. Cortisone cream. Calamine lotion. If told, put ice on the bite area. Put ice in a plastic bag. Place a towel between your skin and the bag. Leave the ice on for 20 minutes, 2-3 times a day.  General instructions Apply or take over-the-counter and prescription medicines only as told by your doctor. If you were prescribed an antibiotic medicine, take or apply it as told by your doctor. Do not stop using the antibiotic even if your condition improves. Keep all follow-up visits as told by your doctor. This is important. How is this prevented? To help you have a lower risk of insect bites: When you are outside, wear clothing that covers your arms  and legs. Use insect repellent. The best insect repellents contain one of these: DEET. Picaridin. Oil of lemon eucalyptus (OLE). [id number]. Consider spraying your clothing with a pesticide called permethrin. Permethrin helps prevent insect bites. It works for several weeks and for up to 5-6 clothing washes. Do not apply permethrin directly to the skin. If your home windows  do not have screens, think about putting some in. If you will be sleeping in an area where there are mosquitoes, consider covering your sleeping area with a mosquito net. Contact a doctor if: You have redness, swelling, or pain in the bite area. You have fluid or blood coming from the bite area. The bite area feels warm to the touch. You have pus or a bad smell coming from the bite area. You have a fever. Get help right away if: You have joint pain. You have a rash. You feel more tired or sleepy than you normally do. You have neck pain. You have a headache. You feel weaker than you normally do. You have signs of an anaphylactic reaction. Signs may include: Feeling warm in the face. Itchy, red, swollen areas of skin. Swelling of your: Eyes. Lips. Face. Mouth. Tongue. Throat. Trouble with any of these: Breathing. Talking. Swallowing. Loud breathing. Feeling dizzy or light-headed. Passing out. Pain or cramps in your belly. Throwing up. Watery poop. These symptoms may be an emergency. Do not wait to see if the symptoms will go away. Do this right away: Use your auto-injector pen as you have been told. Get medical help. Call your local emergency services (911 in the [place] Do not drive yourself to the hospital. Summary An insect bite can make your skin red, itchy, and swollen. Treatment is usually not needed. Symptoms often go away on their own. Do not scratch the bite area. Keep it clean and dry. Ice can help with pain and itching from the bite. This information is not intended to replace advice given to you by your health care provider. Make sure you discuss any questions you have with your healthcare provider. Document Revised: 02/10/2020 Document Reviewed: 09/28/2017 Elsevier Patient Education  2022 [employer].

## 2020-11-24 ENCOUNTER — Other Ambulatory Visit: Payer: Self-pay | Admitting: Medical

## 2020-11-24 ENCOUNTER — Telehealth: Payer: Self-pay | Admitting: Medical

## 2020-11-24 MED ORDER — FLUTICASONE-SALMETEROL 250-50 MCG/ACT IN AEPB: 1.0000 | INHALATION_SPRAY | Freq: Two times a day (BID) | RESPIRATORY_TRACT | 1 refills | Status: DC

## 2020-11-24 MED ORDER — BUDESONIDE-FORMOTEROL FUMARATE 160-4.5 MCG/ACT IN AERO: 2.0000 | INHALATION_SPRAY | Freq: Two times a day (BID) | RESPIRATORY_TRACT | 1 refills | Status: DC

## 2020-11-24 NOTE — Telephone Encounter (Signed)
PT called back, advair too expensive  She wants to try symbicort ( daughter takes and her insurance covers that)

## 2020-11-24 NOTE — Telephone Encounter (Signed)
Pt informed and appt scheduled.

## 2020-11-24 NOTE — Telephone Encounter (Signed)
Pt called she wants to get a different inhaler, other than the pulmicort. She states she Doesn't like the way she has to use it and feels like she is not getting anything when she uses it and ends up having to use nebulizer also  She states you are aware of her wheezing issues

## 2020-11-29 ENCOUNTER — Telehealth: Payer: Self-pay

## 2020-11-29 NOTE — Telephone Encounter (Signed)
Pt. Called stating that her sister told her she could get a inhaler OTC. She stated that her inhaler has now gone up to 85 dollars and she is having a hard time affording that. She wanted to know if you knew which inhaler she could get OTC and which one you recommended for her.

## 2020-12-08 ENCOUNTER — Ambulatory Visit (INDEPENDENT_AMBULATORY_CARE_PROVIDER_SITE_OTHER): Payer: No Typology Code available for payment source | Admitting: Medical

## 2020-12-08 ENCOUNTER — Other Ambulatory Visit: Payer: Self-pay

## 2020-12-08 VITALS — BP 120/80 | HR 92 | Resp 16 | Wt 174.6 lb

## 2020-12-08 DIAGNOSIS — J45909 Unspecified asthma, uncomplicated: Secondary | ICD-10-CM | POA: Insufficient documentation

## 2020-12-08 DIAGNOSIS — J4541 Moderate persistent asthma with (acute) exacerbation: Secondary | ICD-10-CM

## 2020-12-08 DIAGNOSIS — R0683 Snoring: Secondary | ICD-10-CM | POA: Insufficient documentation

## 2020-12-08 MED ORDER — ALBUTEROL SULFATE (2.5 MG/3ML) 0.083% IN NEBU: INHALATION_SOLUTION | RESPIRATORY_TRACT | 1 refills | Status: DC

## 2020-12-08 MED ORDER — AIRDUO DIGIHALER 232-14 MCG/ACT IN AEPB: 1.0000 | INHALATION_SPRAY | Freq: Every day | RESPIRATORY_TRACT | 2 refills | Status: DC

## 2020-12-08 MED ORDER — MONTELUKAST SODIUM 10 MG PO TABS: 10.0000 mg | ORAL_TABLET | Freq: Every day | ORAL | 3 refills | Status: DC

## 2020-12-08 MED ORDER — ALBUTEROL SULFATE HFA 108 (90 BASE) MCG/ACT IN AERS: 2.0000 | INHALATION_SPRAY | Freq: Four times a day (QID) | RESPIRATORY_TRACT | 1 refills | Status: DC | PRN

## 2020-12-08 NOTE — Progress Notes (Signed)
Subjective: Chief Complaint  Patient presents with   asthma issues.     Asthma issues and discuss sleep apnea.    Here problems with Asthma.  She notes asthma giving her prolems.   Having wheezing, SOB, coughing up phlegm.  Has to use neublier before bedite or awakening SOB. 2 nights recnelty shw awoke in the night having to use nebulizer therapy.  She notes having problems for months.  Actually she feels like she has abdomen problems around.  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  She does not want to have allergy testing.  She is afraid of all the needles for allergy testing.  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   No current facility-administered medications on file prior to visit.   ROS as in subjective   Objective: BP 120/80   Pulse 92   Resp 16   Wt 174 lb 9.6 oz (79.2 kg)   LMP 03/29/2017   SpO2 99%   BMI 35.26 kg/m   General appearence: alert, no distress, WD/WN, coughing quite a bit HEENT: normocephalic, sclerae anicteric, TMs pearly, nares patent, no discharge or erythema, pharynx normal Oral cavity: MMM, no lesions Neck: supple, no lymphadenopathy, no thyromegaly, no masses,, no JVD or bruits Heart: RRR, normal S1, S2, no murmurs Lungs: scattered wheezes, otherwise no rhonchi, or rales Pulses: 2+ symmetric, upper and lower extremities, normal cap refill     Assessment Encounter Diagnoses  Name Primary?   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  Begin trial of air duo preventative inhaler.  Discussed proper use.  Continue albuterol as needed for flareups.  Discussed proper use of  each medication and purpose of each medication.  Begin Singulair daily.  Avoid triggers when possible.  He will let me know in a few weeks how well this regimen is working.  Snoring-has not had concern for sleep apnea but she seemed to have uncontrolled asthma causing her problems right now.  We can reassess her sleep apnea if this continues to be a concern once her asthma is under better control   Arianny was seen today for asthma issues. .  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 -     Fluticasone-Salmeterol,sensor, (AIRDUO DIGIHALER) 232-14 MCG/ACT AEPB; Inhale 1 puff into the lungs daily. -     albuterol (PROVENTIL) (2.5 MG/3ML) 0.083% nebulizer solution; USE 1 VIAL IN NEBULIZER EVERY 6 HOURS AS NEEDED FOR WHEEZING OR SHORTNESS OF BREATH -     albuterol (VENTOLIN HFA) 108 (90 Base) MCG/ACT inhaler; Inhale 2 puffs into the lungs every 6 (six) hours as needed for wheezing.   F/u with call report in a few weeks

## 2020-12-20 ENCOUNTER — Ambulatory Visit: Payer: No Typology Code available for payment source | Admitting: Medical

## 2020-12-22 ENCOUNTER — Telehealth: Payer: Self-pay | Admitting: Medical

## 2020-12-22 NOTE — Telephone Encounter (Signed)
Pt called inhaler sample that you gave is causing thrush ( It's the smart inhaler sample you gave her)

## 2020-12-23 NOTE — Telephone Encounter (Signed)
Pt was notified. It has gone away for now but she is not using inhaler as much. She will let us know if any concerns

## 2021-04-18 ENCOUNTER — Other Ambulatory Visit: Payer: Self-pay | Admitting: Medical

## 2021-04-18 ENCOUNTER — Telehealth: Payer: Self-pay

## 2021-04-18 DIAGNOSIS — M542 Cervicalgia: Secondary | ICD-10-CM

## 2021-04-18 DIAGNOSIS — M5410 Radiculopathy, site unspecified: Secondary | ICD-10-CM

## 2021-04-18 DIAGNOSIS — R200 Anesthesia of skin: Secondary | ICD-10-CM

## 2021-04-18 NOTE — Telephone Encounter (Signed)
Pt. Called stating she would like a referral to a neurologist. She has seen you already in the past for this issue. She stated you recommended that she try some braces and she is still having the issue with her hands getting numb during the day.

## 2021-04-27 ENCOUNTER — Ambulatory Visit (INDEPENDENT_AMBULATORY_CARE_PROVIDER_SITE_OTHER): Payer: No Typology Code available for payment source | Admitting: Physician Assistant

## 2021-04-27 ENCOUNTER — Other Ambulatory Visit: Payer: Self-pay | Admitting: Obstetrics and Gynecology

## 2021-04-27 ENCOUNTER — Encounter: Payer: Self-pay | Admitting: Physician Assistant

## 2021-04-27 DIAGNOSIS — R200 Anesthesia of skin: Secondary | ICD-10-CM

## 2021-04-27 DIAGNOSIS — Z803 Family history of malignant neoplasm of breast: Secondary | ICD-10-CM

## 2021-04-27 NOTE — Addendum Note (Signed)
Addended by: Robyne Peers on: 04/27/2021 04:26 PM   Modules accepted: Orders

## 2021-04-27 NOTE — Progress Notes (Signed)
Office Visit Note   Patient: Claudia Hendrix           Date of Birth: 1975/06/04           MRN: [id number] Visit Date: 04/27/2021              Requested by: Claudia Hurl, PA-C [address] [place],  [place] [address] PCP: Claudia Hurl, PA-C   Assessment & Plan: Visit Diagnoses:  1. Bilateral hand numbness     Plan: We will have her undergo EMG nerve conduction studies bilateral upper extremities to rule out carpal tunnel syndrome.  Should follow-up with Claudia Hendrix after the studies to go over results discuss further treatment.  In the wrapping up her appointment today she did mention that she has knee pain right greater than left and states that her knees lock at times.  She has concerns about swelling she points to the popliteal region of both knees.  She has overall good range of motion of both knees and describes no mechanical symptoms of either knee with ambulation only "locking up" of her right knee whenever she is sleeping at night.  Therefore head next office visit would like to get AP and lateral views of her right knee.  Follow-Up Instructions: Return After EMG/NCS.   Orders:  No orders of the defined types were placed in this encounter.  No orders of the defined types were placed in this encounter.     Procedures: No procedures performed   Clinical Data: No additional findings.   Subjective: Chief Complaint  Patient presents with   Neck - Pain    HPI Claudia Hendrix 46 year old female comes in today complaining of bilateral hand numbness.  Right worse than left.  States this is been ongoing for least 3 months.  However she was seen by her family medicine provider Claudia Bode, PA-C for numbness in her fingers left worse than right in December 2021 and radiographs of her neck were obtained.  I personally reviewed the radiographs of her neck.  These she has multiple views of the cervical spine showed no acute fractures.  Disc base overall  well-maintained.  Slight loss of normal lordotic curvature.  No acute findings.  No spondylolisthesis. She notes that she has had some neck stiffness in the past but she states this is overall better.  She does have occasionally have some pain down predominantly her right arm but this is rare.  She notes whenever she is holding objects like a phone or tablet or vacuuming that her hands go numb.  She feels like the both hands go numb throughout.  She notes she has to shake her hands when they do go numb.  She is tried wrist splints she states these helped at first but notes now it makes her symptoms worse.  Patient is nondiabetic.  Review of Systems See HPI otherwise negative or noncontributory.  Objective: Vital Signs: LMP 03/29/2017   Physical Exam Constitutional:      Appearance: She is not ill-appearing or diaphoretic.  Pulmonary:     Effort: Pulmonary effort is normal.  Neurological:     Mental Status: She is alert and oriented to person, place, and time.  Psychiatric:        Mood and Affect: Mood normal.    Ortho Exam Bilateral hands full motor and subjective normal sensation.  Negative Tinel's and compression test over the median nerves bilaterally.  Negative Phalen's test bilaterally. Upper extremities bilaterally: 5 out of 5  strength throughout the upper extremities against resistance.  Good range of motion bilateral shoulders and no strength deficits rotator cuff. Cervical spine good range of motion cervical spine without pain.  Negative Spurling's.  Tenderness over the lower lumbar spine and the right trapezius region.  Nontender over the medial borders of scapula bilaterally.   Specialty Comments:  No specialty comments available.  Imaging: No results found.   PMFS History: Patient Active Problem List   Diagnosis Date Noted   Snoring 12/08/2020   Extrinsic asthma 12/08/2020   Encounter for health maintenance examination in adult 08/11/2020   Mild intermittent asthma  without complication 01/77/9390   Vaccine refused by patient 08/11/2020   Vaccine counseling 08/11/2020   Vitamin D deficiency 08/11/2020   Paresthesia of arm 08/11/2020   Screening for diabetes mellitus 08/11/2020   Radicular pain of upper extremity 03/19/2020   Neck pain 03/19/2020   Arm numbness 03/19/2020   Moderate persistent asthma with acute exacerbation 08/06/2019   Foot pain, bilateral 08/06/2019   Plantar fasciitis 08/06/2019   Allergic rhinitis due to pollen 08/06/2019   S/P laparoscopic assisted vaginal hysterectomy (LAVH) 03/29/2017   S/P tubal ligation 01/22/2015    Class: Status post   Asthma flare 05/14/2013   Allergic rhinitis 05/14/2013   Fibroids 04/07/2012   Obesity 04/07/2012   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    Asthma 05/2013   Environmental allergies    Fibroid, uterine    Wears glasses     Family History  Problem Relation Age of Onset   Kidney disease Mother 49   Diabetes Paternal Grandmother    Cancer Paternal Grandmother        lung   Birth defects Other        Developmental delay   Diabetes Other    Hypertension Other    Thyroid disease Other    Stroke Other    Hypertension Other    Hypertension Maternal Grandfather     Past Surgical History:  Procedure Laterality Date   CESAREAN SECTION  2011   With fibroid removal   CYSTOSCOPY N/A 03/29/2017   Procedure: CYSTOSCOPY;  Surgeon: Claudia Nigh, MD;  Location: [facility];  Service: Gynecology;  Laterality: N/A;   LAPAROSCOPIC TUBAL LIGATION Left 12/11/2014   Procedure: LAPAROSCOPIC TUBAL LIGATION;  Surgeon: Claudia Nigh, MD;  Location: [facility];  Service: Gynecology;  Laterality: Left;   LAPAROSCOPIC TUBAL LIGATION Bilateral 01/22/2015   Procedure: LAPAROSCOPIC TUBAL LIGATION;  Surgeon: Claudia Nigh, MD;  Location: [facility];  Service: Gynecology;  Laterality: Bilateral;   LAPAROSCOPIC VAGINAL HYSTERECTOMY WITH SALPINGECTOMY Bilateral 03/29/2017    Procedure: LAPAROSCOPIC ASSISTED VAGINAL HYSTERECTOMY WITH SALPINGECTOMY;  Surgeon: Claudia Nigh, MD;  Location: [facility];  Service: Gynecology;  Laterality: Bilateral;   LIPOMA EXCISION     removal of bone spur from right elbow      TUBAL LIGATION     wrist cyst     WRIST SURGERY     Social History   Occupational History   Not on file  Tobacco Use   Smoking status: Never   Smokeless tobacco: Never  Vaping Use   Vaping Use: Never used  Substance and Sexual Activity   Alcohol use: No    Comment: occasionally   Drug use: No   Sexual activity: Yes    Birth control/protection: Pill

## 2021-05-07 ENCOUNTER — Other Ambulatory Visit: Payer: Self-pay | Admitting: Obstetrics and Gynecology

## 2021-05-07 DIAGNOSIS — R928 Other abnormal and inconclusive findings on diagnostic imaging of breast: Secondary | ICD-10-CM

## 2021-05-20 ENCOUNTER — Encounter: Payer: No Typology Code available for payment source | Admitting: Physical Medicine and Rehabilitation

## 2021-06-03 ENCOUNTER — Encounter: Payer: Self-pay | Admitting: Physical Medicine and Rehabilitation

## 2021-06-03 ENCOUNTER — Ambulatory Visit (INDEPENDENT_AMBULATORY_CARE_PROVIDER_SITE_OTHER): Payer: No Typology Code available for payment source | Admitting: Physical Medicine and Rehabilitation

## 2021-06-03 ENCOUNTER — Other Ambulatory Visit: Payer: Self-pay

## 2021-06-03 DIAGNOSIS — R202 Paresthesia of skin: Secondary | ICD-10-CM

## 2021-06-03 NOTE — Progress Notes (Signed)
Pt state she has pain, tingling and numbness in both hands and travels up her right shoulder. Pt state she has numbness in her index, middle and ring fingers. Pt state the pain and numbness comes when she is sleeping so she tries to rest her arms on a pillows to help her sleep. 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 Pt state she is right handed. ? ?Numeric Pain Rating Scale and Functional Assessment ?Average Pain 1 ? ? ?In the last MONTH (on 0-10 scale) has pain interfered with the following? ? ?1. General activity like being  able to carry out your everyday physical activities such as walking, climbing stairs, carrying groceries, or moving a chair?  ?Rating(7) ? ? ?

## 2021-06-03 NOTE — Progress Notes (Signed)
? ?Claudia Hendrix - 46 y.o. female MRN [id number]  Date of birth: July 15, 1975 ? ?Office Visit Note: ?Visit Date: 06/03/2021 ?PCP: Carlena Hurl, PA-C ?Referred by: Mcarthur Rossetti* ? ?Subjective: ?Chief Complaint  ?Patient presents with  ? Right Shoulder - Pain  ? Right Hand - Pain, Numbness, Tingling  ? Left Hand - Pain, Numbness, Tingling  ? ?HPI:  Claudia Hendrix is a 46 y.o. female who comes in today at the request of Benita Stabile, PA-C for electrodiagnostic study of the Bilateral upper extremities.  Patient is Right hand dominant. bilateral hand numbness.  Right worse than left.  States this is been ongoing for least 3 months.  However she was seen by her family medicine provider Chana Bode, PA-C for numbness in her fingers left worse than right in December 2021.  She describes symptoms that refer up into the right shoulder.  Numbness and tingling seems to be more in the radial 3 digits.  She asked a Orrester arms on pillows at night as she does get significant nocturnal complaints.  She has no prior electrodiagnostic studies.  ? ? ?ROS Otherwise per HPI. ? ?Assessment & Plan: ?Visit Diagnoses:  ?  ICD-10-CM   ?1. Paresthesia of skin  R20.2 NCV with EMG (electromyography)  ?  ?  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  ? ?There is no significant electrodiagnostic evidence of any other focal nerve entrapment, brachial plexopathy or cervical radiculopathy.  ? ?Recommendations: ?1.  Follow-up with referring physician. ?2.  Continue current management of symptoms. ?3.  Continue use of resting splint at night-time and as needed during the day. ?4.  Suggest surgical evaluation. ? ?Meds & Orders: No orders of the defined types were placed in this encounter. ?  ?Orders Placed This Encounter  ?Procedures  ? NCV with EMG  (electromyography)  ?  ?Follow-up: Return in about 2 weeks (around 06/17/2021) for Jean Rosenthal, MD.  ? ?Procedures: ?No procedures performed  ?EMG & NCV Findings: ?Evaluation of the left median motor and the right median motor nerves showed prolonged distal onset latency (L4.8, R4.8 ms) and decreased conduction velocity (Elbow-Wrist, L47, R49 m/s).  The left median (across palm) sensory nerve showed no response (Palm) and prolonged distal peak latency (5.2 ms).  The right median (across palm) sensory nerve showed prolonged distal peak latency (Wrist, 4.3 ms).  All remaining nerves (as indicated in the following tables) were within normal limits.  All left vs. right side differences were within normal limits.   ? ?All examined muscles (as indicated in the following table) showed no evidence of electrical instability.   ? 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  ? ?There is no significant electrodiagnostic evidence of any other focal nerve entrapment, brachial plexopathy or cervical radiculopathy.  ? ?Recommendations: ?1.  Follow-up with referring physician. ?2.  Continue current management of symptoms. ?3.  Continue use of resting splint at night-time and as needed during the day. ?4.  Suggest surgical evaluation. ? ?___________________________ ?Laurence Spates FAAPMR ?Board Certified, Tax adviser of Physical Medicine and Rehabilitation ? ? ? ?Nerve Conduction Studies ?Anti Sensory Summary Table ? ? Stim Site NR Peak (ms) Norm Peak (ms) P-T Amp (?V) Norm P-T Amp Site1 Site2 Delta-P (  ms) Dist (cm) Vel (m/s) Norm Vel (m/s)  ?Left Median Acr Palm Anti Sensory (2nd Digit)  32.2?C  ?Wrist    *5.2 <3.6 15.6 >10 Wrist Palm  0.0    ?Palm *NR  <2.0          ?Right Median Acr Palm Anti Sensory (2nd Digit)  31.6?C  ?Wrist    *4.3 <3.6 16.2 >10 Wrist Palm  2.7 0.0    ?Palm    1.6 <2.0 5.5         ?Left Radial Anti Sensory (Base 1st Digit)  32.1?C  ?Wrist    2.0 <3.1 19.3  Wrist Base 1st Digit 2.0 0.0    ?Right Radial Anti Sensory (Base 1st Digit)  31.6?C  ?Wrist    1.9 <3.1 26.1  Wrist Base 1st Digit 1.9 0.0    ?Left Ulnar Anti Sensory (5th Digit)  32.2?C  ?Wrist    3.0 <3.7 30.4 >15.0 Wrist 5th Digit 3.0 14.0 47 >38  ?Right Ulnar Anti Sensory (5th Digit)  31.7?C  ?Wrist    2.9 <3.7 20.7 >15.0 Wrist 5th Digit 2.9 14.0 48 >38  ? ?Motor Summary Table ? ? Stim Site NR Onset (ms) Norm Onset (ms) O-P Amp (mV) Norm O-P Amp Site1 Site2 Delta-0 (ms) Dist (cm) Vel (m/s) Norm Vel (m/s)  ?Left Median Motor (Abd Poll Brev)  32?C  ?Wrist    *4.8 <4.2 7.1 >5 Elbow Wrist 3.6 17.0 *47 >50  ?Elbow    8.4  7.2         ?Right Median Motor (Abd Poll Brev)  31.5?C  ?Wrist    *4.8 <4.2 6.0 >5 Elbow Wrist 3.6 17.5 *49 >50  ?Elbow    8.4  5.6         ?Left Ulnar Motor (Abd Dig Min)  32.1?C  ?Wrist    2.7 <4.2 13.5 >3 B Elbow Wrist 2.7 17.5 65 >53  ?B Elbow    5.4  13.4  A Elbow B Elbow 1.5 10.0 67 >53  ?A Elbow    6.9  13.0         ?Right Ulnar Motor (Abd Dig Min)  31.6?C  ?Wrist    2.6 <4.2 13.0 >3 B Elbow Wrist 2.9 17.0 59 >53  ?B Elbow    5.5  13.4  A Elbow B Elbow 1.5 10.0 67 >53  ?A Elbow    7.0  13.1         ? ?EMG ? ? Side Muscle Nerve Root Ins Act Fibs Psw Amp Dur Poly Recrt Int Fraser Din Comment  ?Right Abd Poll Brev Median C8-T1 Nml Nml Nml Nml Nml 0 Nml Nml   ?Right 1stDorInt Ulnar C8-T1 Nml Nml Nml Nml Nml 0 Nml Nml   ?Right PronatorTeres Median C6-7 Nml Nml Nml Nml Nml 0 Nml Nml   ?Right Biceps Musculocut C5-6 Nml Nml Nml Nml Nml 0 Nml Nml   ?Right Deltoid Axillary C5-6 Nml Nml Nml Nml Nml 0 Nml Nml   ? ? ?Nerve Conduction Studies ?Anti Sensory Left/Right Comparison ? ? Stim Site L Lat (ms) R Lat (ms) L-R Lat (ms) L Amp (?V) R Amp (?V) L-R Amp (%) Site1 Site2 L Vel (m/s) R Vel (m/s) L-R Vel (m/s)  ?Median Acr Palm Anti Sensory (2nd Digit)  32.2?C  ?Wrist *5.2 *4.3 0.9 15.6 16.2 3.7  Wrist Palm     ?Palm  1.6   5.5        ?Radial Anti Sensory (Base 1st Digit)  32.1?C  ?Wrist 2.0 1.9 0.1 19.3 26.1 26.1 Wrist Base 1st Digit     ?Ulnar Anti Sensory (5th Digit)  32.2?C  ?Wrist 3.0 2.9 0.1 30.4 20.7 31.9 Wrist 5th Digit 47 48 1  ? ?Motor Left/Right Comparison ? ? Stim Site L Lat (ms) R Lat (ms) L-R Lat (ms) L Amp (mV) R Amp (mV) L-R Amp (%) Site1 Site2 L Vel (m/s) R Vel (m/s) L-R Vel (m/s)  ?Median Motor (Abd Poll Brev)  32?C  ?Wrist *4.8 *4.8 0.0 7.1 6.0 15.5 Elbow Wrist *47 *49 2  ?Elbow 8.4 8.4 0.0 7.2 5.6 22.2       ?Ulnar Motor (Abd Dig Min)  32.1?C  ?Wrist 2.7 2.6 0.1 13.5 13.0 3.7 B Elbow Wrist 65 59 6  ?B Elbow 5.4 5.5 0.1 13.4 13.4 0.0 A Elbow B Elbow 67 67 0  ?A Elbow 6.9 7.0 0.1 13.0 13.1 0.8       ? ? ? ?Waveforms: ?    ? ?    ? ?    ? ?  ? ?  ? ?Clinical History: ?No specialty comments available.  ? ? ? ?Objective:  VS:  HT:    WT:   BMI:     BP:   HR: bpm  TEMP: ( )  RESP:  ?Physical Exam ?Musculoskeletal:     ?   General: No swelling, tenderness or deformity.  ?   Comments: Inspection reveals no atrophy of the bilateral APB or FDI or hand intrinsics. There is no swelling, color changes, allodynia or dystrophic changes. There is 5 out of 5 strength in the bilateral wrist extension, finger abduction and long finger flexion. There is intact sensation to light touch in all dermatomal and peripheral nerve distributions. There is a negative Hoffmann's test bilaterally.  ?Skin: ?   General: Skin is warm and dry.  ?   Findings: No erythema or rash.  ?Neurological:  ?   General: No focal deficit present.  ?   Mental Status: She is alert and oriented to person, place, and time.  ?   Motor: No weakness or abnormal muscle tone.  ?   Coordination: Coordination normal.  ?Psychiatric:     ?   Mood and Affect: Mood normal.     ?   Behavior: Behavior normal.  ?  ? ?Imaging: ?No results found. ?

## 2021-06-06 NOTE — Procedures (Signed)
EMG & NCV Findings: ?Evaluation of the left median motor and the right median motor nerves showed prolonged distal onset latency (L4.8, R4.8 ms) and decreased conduction velocity (Elbow-Wrist, L47, R49 m/s).  The left median (across palm) sensory nerve showed no response (Palm) and prolonged distal peak latency (5.2 ms).  The right median (across palm) sensory nerve showed prolonged distal peak latency (Wrist, 4.3 ms).  All remaining nerves (as indicated in the following tables) were within normal limits.  All left vs. right side differences were within normal limits.   ? ?All examined muscles (as indicated in the following table) showed no evidence of electrical instability.   ? 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  ? ?There is no significant electrodiagnostic evidence of any other focal nerve entrapment, brachial plexopathy or cervical radiculopathy.  ? ?Recommendations: ?1.  Follow-up with referring physician. ?2.  Continue current management of symptoms. ?3.  Continue use of resting splint at night-time and as needed during the day. ?4.  Suggest surgical evaluation. ? ?___________________________ ?Laurence Spates FAAPMR ?Board Certified, Tax adviser of Physical Medicine and Rehabilitation ? ? ? ?Nerve Conduction Studies ?Anti Sensory Summary Table ? ? Stim Site NR Peak (ms) Norm Peak (ms) P-T Amp (?V) Norm P-T Amp Site1 Site2 Delta-P (ms) Dist (cm) Vel (m/s) Norm Vel (m/s)  ?Left Median Acr Palm Anti Sensory (2nd Digit)  32.2?C  ?Wrist    *5.2 <3.6 15.6 >10 Wrist Palm  0.0    ?Palm *NR  <2.0          ?Right Median Acr Palm Anti Sensory (2nd Digit)  31.6?C  ?Wrist    *4.3 <3.6 16.2 >10 Wrist Palm 2.7 0.0    ?Palm    1.6 <2.0 5.5         ?Left Radial Anti Sensory (Base 1st Digit)  32.1?C  ?Wrist    2.0 <3.1 19.3  Wrist Base 1st Digit 2.0 0.0     ?Right Radial Anti Sensory (Base 1st Digit)  31.6?C  ?Wrist    1.9 <3.1 26.1  Wrist Base 1st Digit 1.9 0.0    ?Left Ulnar Anti Sensory (5th Digit)  32.2?C  ?Wrist    3.0 <3.7 30.4 >15.0 Wrist 5th Digit 3.0 14.0 47 >38  ?Right Ulnar Anti Sensory (5th Digit)  31.7?C  ?Wrist    2.9 <3.7 20.7 >15.0 Wrist 5th Digit 2.9 14.0 48 >38  ? ?Motor Summary Table ? ? Stim Site NR Onset (ms) Norm Onset (ms) O-P Amp (mV) Norm O-P Amp Site1 Site2 Delta-0 (ms) Dist (cm) Vel (m/s) Norm Vel (m/s)  ?Left Median Motor (Abd Poll Brev)  32?C  ?Wrist    *4.8 <4.2 7.1 >5 Elbow Wrist 3.6 17.0 *47 >50  ?Elbow    8.4  7.2         ?Right Median Motor (Abd Poll Brev)  31.5?C  ?Wrist    *4.8 <4.2 6.0 >5 Elbow Wrist 3.6 17.5 *49 >50  ?Elbow    8.4  5.6         ?Left Ulnar Motor (Abd Dig Min)  32.1?C  ?Wrist    2.7 <4.2 13.5 >3 B Elbow Wrist 2.7 17.5 65 >53  ?B Elbow    5.4  13.4  A Elbow B Elbow 1.5 10.0 67 >53  ?A Elbow    6.9  13.0         ?  Right Ulnar Motor (Abd Dig Min)  31.6?C  ?Wrist    2.6 <4.2 13.0 >3 B Elbow Wrist 2.9 17.0 59 >53  ?B Elbow    5.5  13.4  A Elbow B Elbow 1.5 10.0 67 >53  ?A Elbow    7.0  13.1         ? ?EMG ? ? Side Muscle Nerve Root Ins Act Fibs Psw Amp Dur Poly Recrt Int Fraser Din Comment  ?Right Abd Poll Brev Median C8-T1 Nml Nml Nml Nml Nml 0 Nml Nml   ?Right 1stDorInt Ulnar C8-T1 Nml Nml Nml Nml Nml 0 Nml Nml   ?Right PronatorTeres Median C6-7 Nml Nml Nml Nml Nml 0 Nml Nml   ?Right Biceps Musculocut C5-6 Nml Nml Nml Nml Nml 0 Nml Nml   ?Right Deltoid Axillary C5-6 Nml Nml Nml Nml Nml 0 Nml Nml   ? ? ?Nerve Conduction Studies ?Anti Sensory Left/Right Comparison ? ? Stim Site L Lat (ms) R Lat (ms) L-R Lat (ms) L Amp (?V) R Amp (?V) L-R Amp (%) Site1 Site2 L Vel (m/s) R Vel (m/s) L-R Vel (m/s)  ?Median Acr Palm Anti Sensory (2nd Digit)  32.2?C  ?Wrist *5.2 *4.3 0.9 15.6 16.2 3.7 Wrist Palm     ?Palm  1.6   5.5        ?Radial Anti Sensory (Base 1st Digit)  32.1?C  ?Wrist 2.0 1.9 0.1 19.3 26.1 26.1 Wrist Base 1st Digit     ?Ulnar  Anti Sensory (5th Digit)  32.2?C  ?Wrist 3.0 2.9 0.1 30.4 20.7 31.9 Wrist 5th Digit 47 48 1  ? ?Motor Left/Right Comparison ? ? Stim Site L Lat (ms) R Lat (ms) L-R Lat (ms) L Amp (mV) R Amp (mV) L-R Amp (%) Site1 Site2 L Vel (m/s) R Vel (m/s) L-R Vel (m/s)  ?Median Motor (Abd Poll Brev)  32?C  ?Wrist *4.8 *4.8 0.0 7.1 6.0 15.5 Elbow Wrist *47 *49 2  ?Elbow 8.4 8.4 0.0 7.2 5.6 22.2       ?Ulnar Motor (Abd Dig Min)  32.1?C  ?Wrist 2.7 2.6 0.1 13.5 13.0 3.7 B Elbow Wrist 65 59 6  ?B Elbow 5.4 5.5 0.1 13.4 13.4 0.0 A Elbow B Elbow 67 67 0  ?A Elbow 6.9 7.0 0.1 13.0 13.1 0.8       ? ? ? ?Waveforms: ?    ? ?    ? ?    ? ?  ? ? ?

## 2021-06-13 ENCOUNTER — Other Ambulatory Visit: Payer: No Typology Code available for payment source

## 2021-06-22 ENCOUNTER — Ambulatory Visit (INDEPENDENT_AMBULATORY_CARE_PROVIDER_SITE_OTHER): Payer: No Typology Code available for payment source | Admitting: Orthopaedic Surgery

## 2021-06-22 ENCOUNTER — Encounter: Payer: Self-pay | Admitting: Orthopaedic Surgery

## 2021-06-22 DIAGNOSIS — G8929 Other chronic pain: Secondary | ICD-10-CM | POA: Diagnosis not present

## 2021-06-22 DIAGNOSIS — G5601 Carpal tunnel syndrome, right upper limb: Secondary | ICD-10-CM

## 2021-06-22 DIAGNOSIS — G5602 Carpal tunnel syndrome, left upper limb: Secondary | ICD-10-CM

## 2021-06-22 DIAGNOSIS — M25511 Pain in right shoulder: Secondary | ICD-10-CM

## 2021-06-22 MED ORDER — CELECOXIB 200 MG PO CAPS: 200.0000 mg | ORAL_CAPSULE | Freq: Two times a day (BID) | ORAL | 3 refills | Status: DC | PRN

## 2021-06-22 NOTE — Progress Notes (Signed)
The patient comes in today to go over nerve conduction studies of her bilateral upper extremities due to significant numbness and tingling in her hands.  She also has been dealing with chronic right shoulder pain and bilateral knee pain.  She also has pain over the trochanteric area of both hips.  She is 46 years old.  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  She has a lot of inflammation in general with her body. ? ?Examination right shoulder shows no deficit of the rotator cuff.  She has full range of motion of the shoulder but does show signs of impingement.  Both hips move smoothly as both knees and is fairly everything is painful in the lower extremities.  She does have weak grip and pinch strength bilaterally and positive Phalen's and Tinel signs bilaterally. ? ?The nerve conduction study shows moderate carpal tunnel syndrome on both her upper extremities. ? ?I did talk to her about the transverse carpal ligament and what carpal tunnel surgery involves.  I talked about carpal tunnel syndrome in detail.  She understands that she does not need to just jump right in the surgery but is something she should consider.  She should get night splints to wear.  I would like to at least try an anti-inflammatory such as Celebrex to see if this will help with some of her chronic inflammatory pain that she is having in multiple areas.  She agrees with this treatment plan.  I do feel that at some point she needs to consider carpal tunnel surgery and she agrees with this as well and will let us know.  All question concerns were answered and addressed.  Offered her physical therapy for her shoulder and her hips and knees but she is deferred this due to the expense of therapy and due to the need to continue to work running her own business. ?

## 2021-07-08 IMAGING — CR DG CERVICAL SPINE COMPLETE 4+V
5 series · 5 of 5 positions shown · non-contrast
Comparison: None.

CLINICAL DATA: Neck pain

EXAM:
CERVICAL SPINE - COMPLETE 4+ VIEW

[w cervical spine lat]
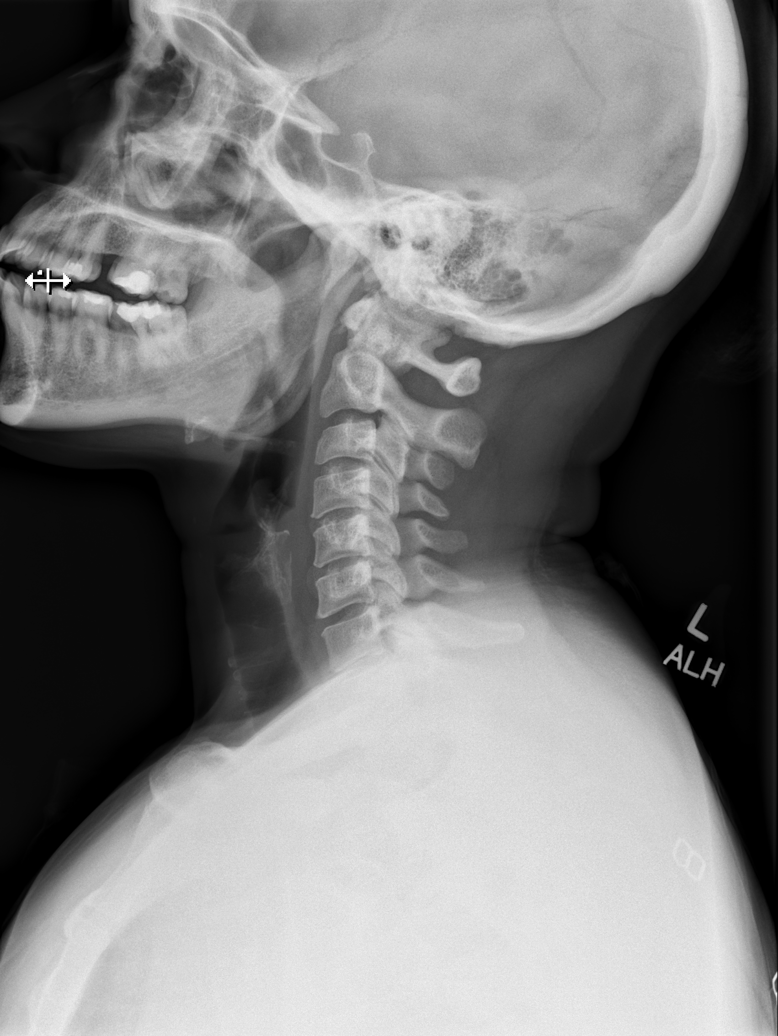

[w cervical spine ap_obl (1 of 2)]
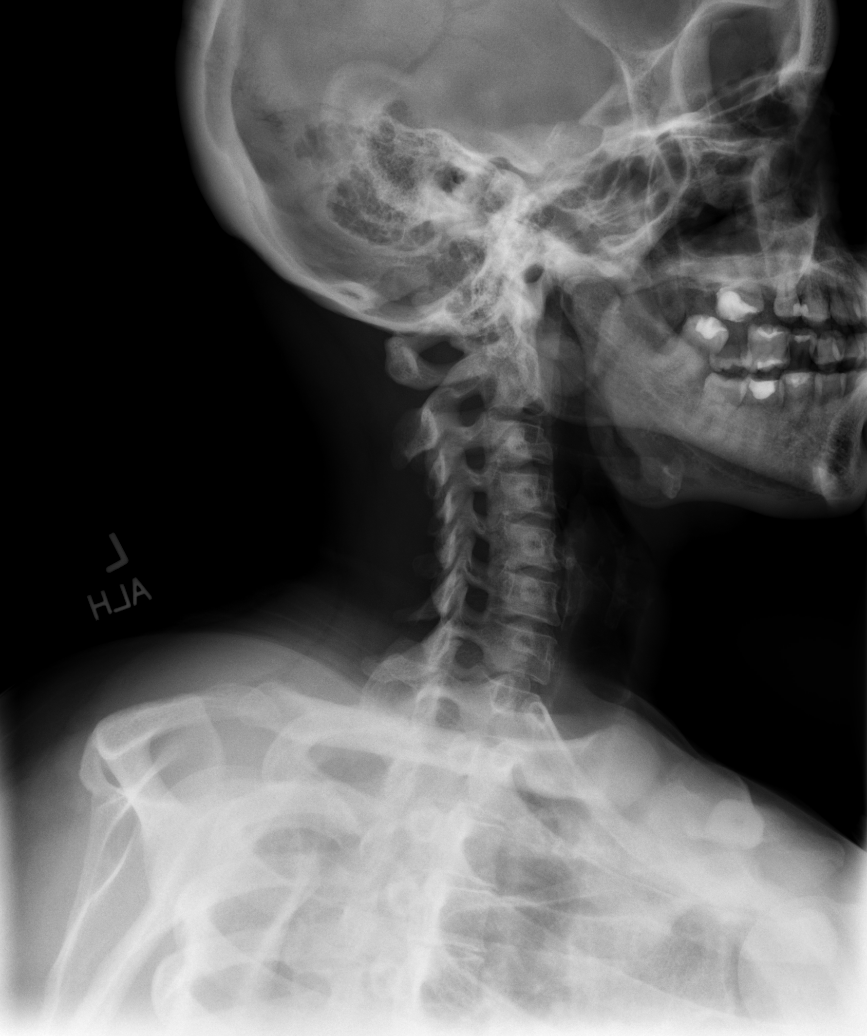

[w cervical spine ap_obl (2 of 2)]
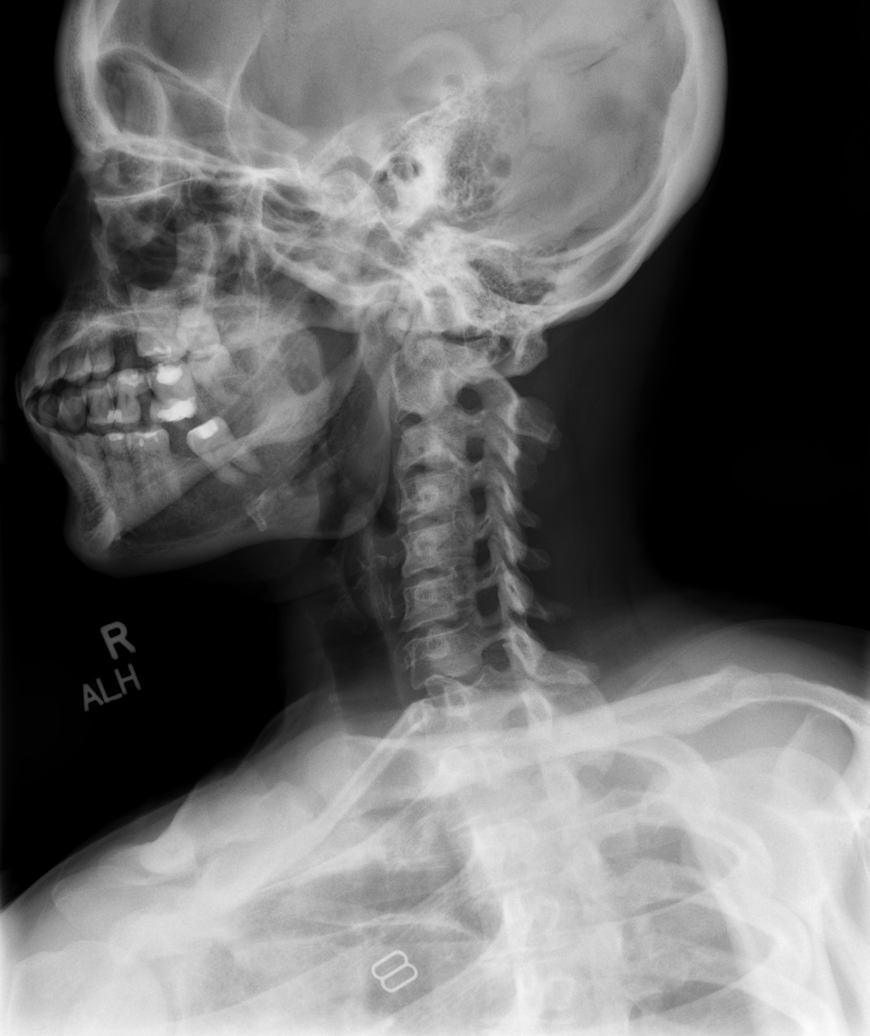

[w cervical spine ap]
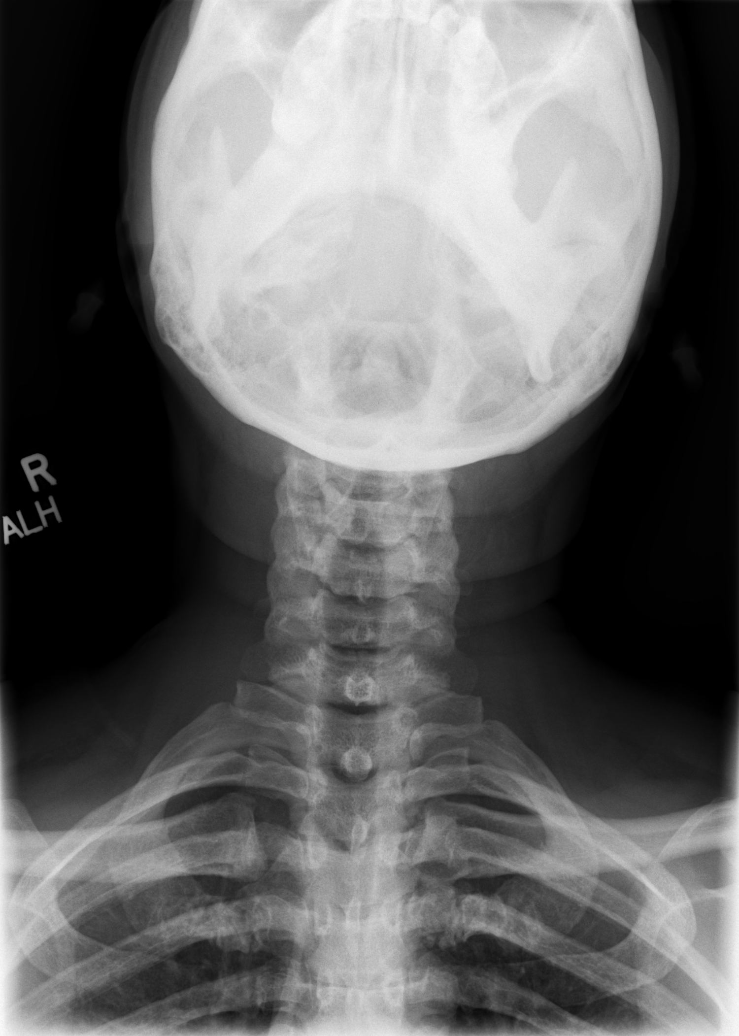

[w cervical swimmers]
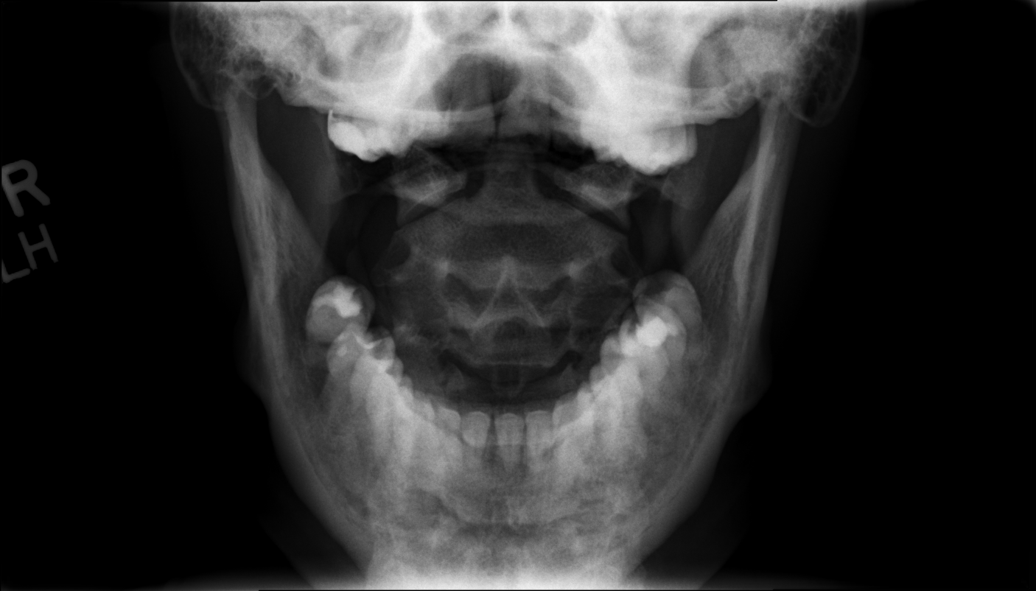

[5 of 5 positions shown; findings below may reference images not displayed]

FINDINGS: Straightening of the cervical spine. Vertebral body heights and disc
spaces appear within normal limits. Possible mild bilateral
foraminal narrowing of the mid cervical spine. Dens and lateral
masses are within normal limits.
IMPRESSION: Straightening of the cervical spine with possible mild mid cervical
foraminal narrowing.

## 2021-07-26 IMAGING — US US BREAST*R* LIMITED INC AXILLA
1 series · 6 of 6 positions shown · non-contrast
Comparison: Previous exam(s).

CLINICAL DATA: Follow-up probably benign mass in the 1 o'clock
position of the right breast.

EXAM:
DIGITAL DIAGNOSTIC BILATERAL MAMMOGRAM WITH CAD AND TOMO
ULTRASOUND RIGHT BREAST

[Series 1: us breast*right* limited inc axilla · 0.06mm/px · 6 of 6 slices shown]
[im 1/6]
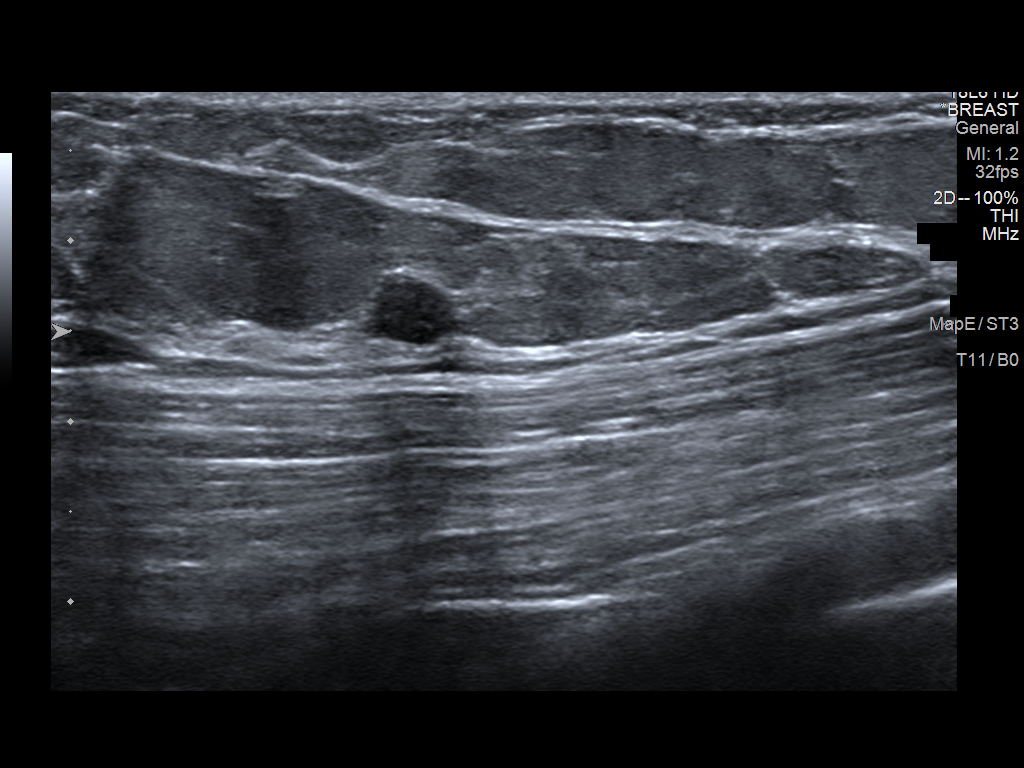
[im 2/6]
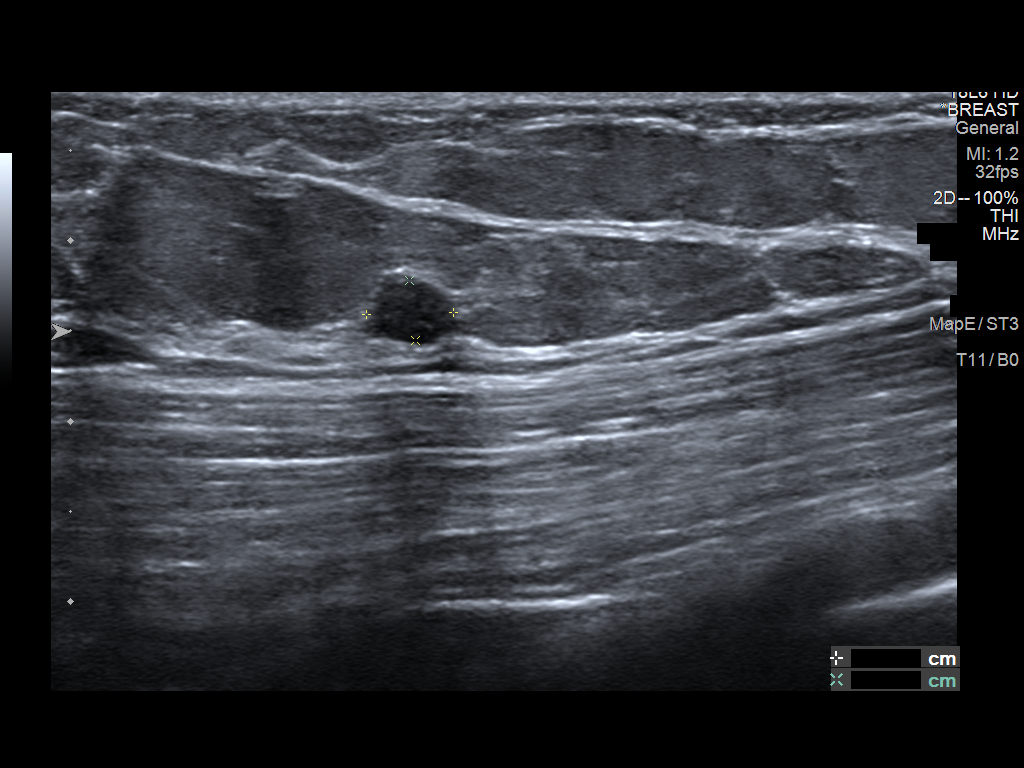
[im 3/6]
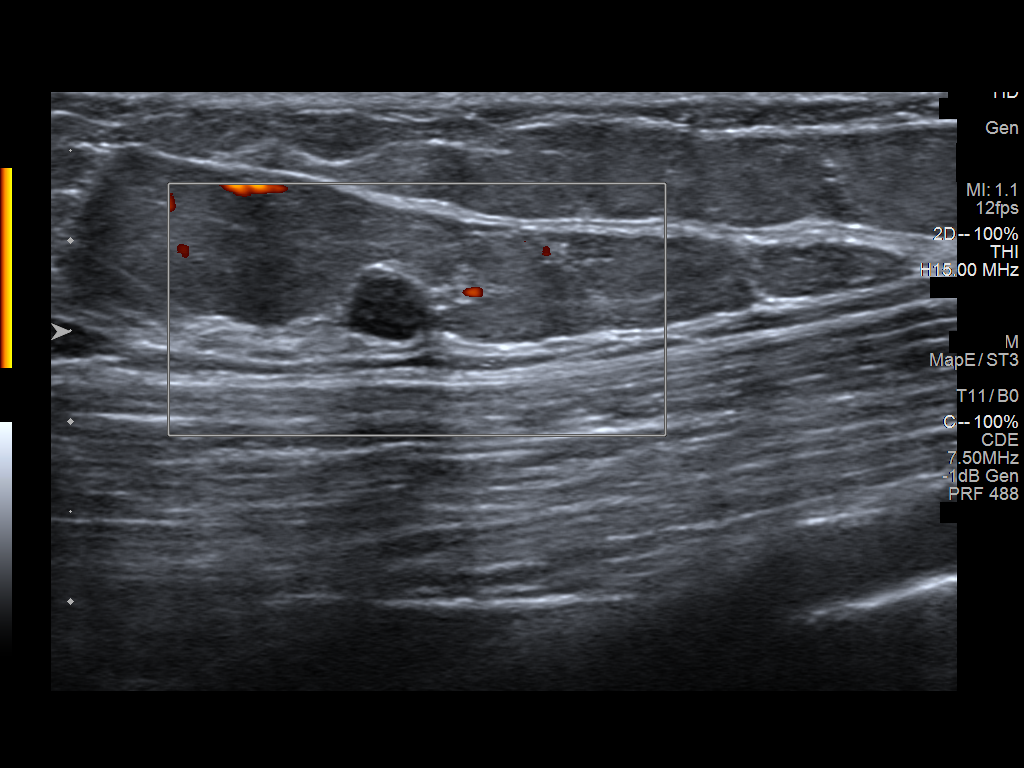
[im 4/6]
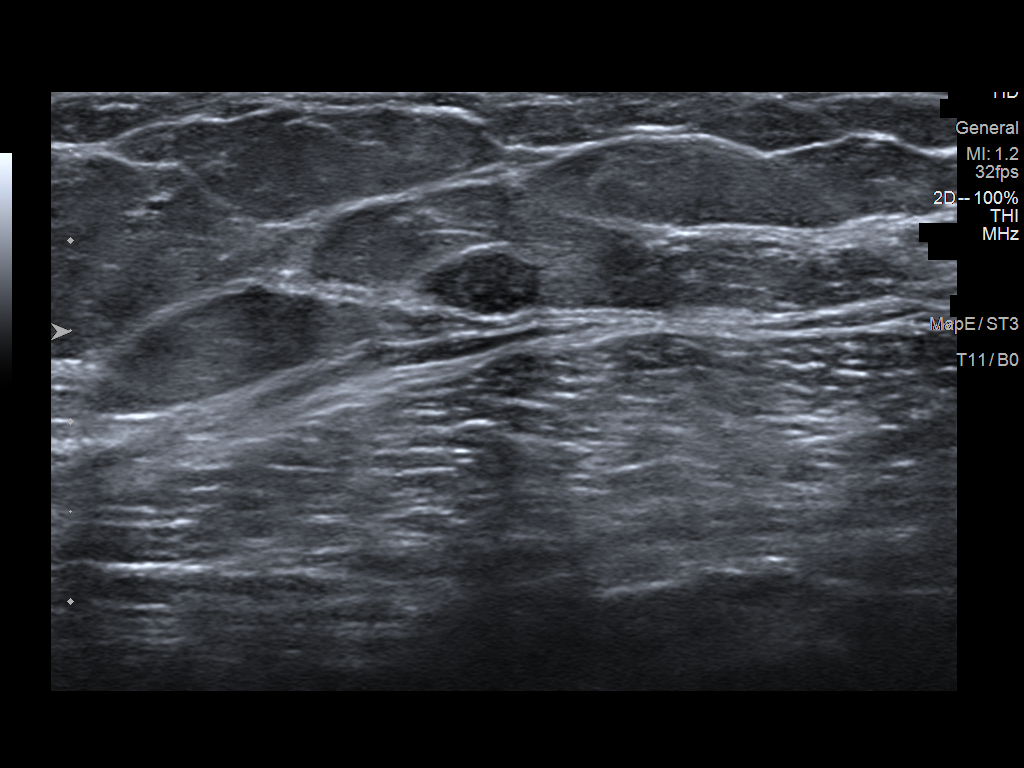
[im 5/6]
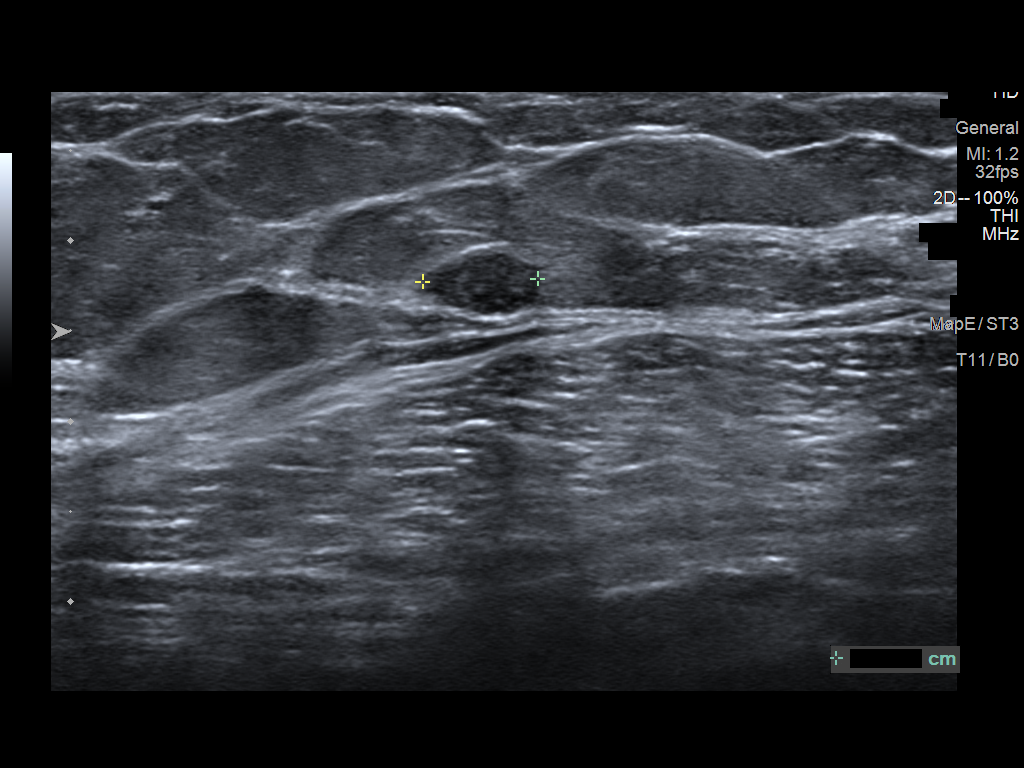
[im 6/6]
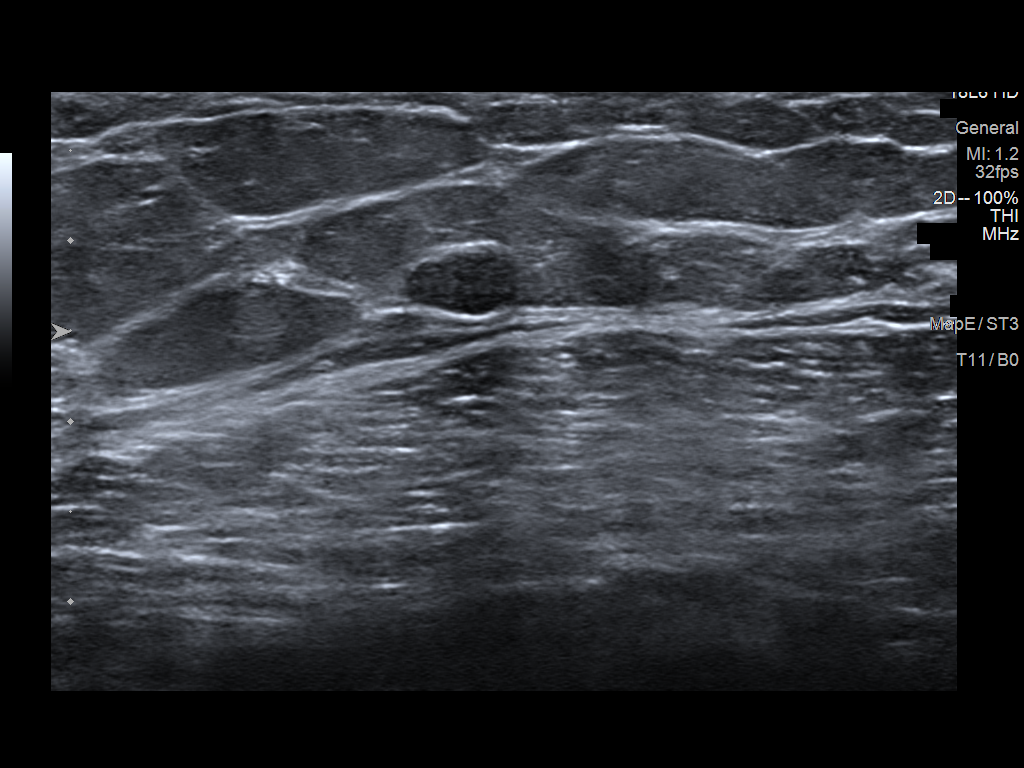

[6 of 6 positions shown; findings below may reference images not displayed]

ACR Breast Density Category c: The breast tissue is heterogeneously
dense, which may obscure small masses.
FINDINGS: The oval, circumscribed mass seen in the 1 o'clock position of the
right breast on 04/03/2019 is unchanged. No interval findings
suspicious for malignancy in either breast.

Mammographic images were processed with CAD.

Targeted ultrasound is performed, showing a 6 x 5 x 3 mm oval,
horizontally oriented, circumscribed, hypoechoic mass in the 1
o'clock position of the right breast, 6 cm from the nipple. This
measured 6 x 5 x 3 mm on 10/03/2019 and 6 x 5 x 4 mm on 04/03/2019.
IMPRESSION: 1. Stable 6 mm probable benign fibroadenoma in the 1 o'clock
position of the right breast.
2. No evidence of malignancy elsewhere in either breast.

RECOMMENDATION:
Bilateral diagnostic mammogram and right breast ultrasound in 1 year
to complete 2 years of follow-up of the probably benign fibroadenoma
in the right.

I have discussed the findings and recommendations with the patient.
If applicable, a reminder letter will be sent to the patient
regarding the next appointment.

BI-RADS CATEGORY  3: Probably benign.

## 2021-08-25 ENCOUNTER — Ambulatory Visit (INDEPENDENT_AMBULATORY_CARE_PROVIDER_SITE_OTHER): Payer: No Typology Code available for payment source

## 2021-08-25 ENCOUNTER — Ambulatory Visit (INDEPENDENT_AMBULATORY_CARE_PROVIDER_SITE_OTHER): Payer: No Typology Code available for payment source | Admitting: Podiatry

## 2021-08-25 DIAGNOSIS — M7752 Other enthesopathy of left foot: Secondary | ICD-10-CM | POA: Diagnosis not present

## 2021-08-25 DIAGNOSIS — M76822 Posterior tibial tendinitis, left leg: Secondary | ICD-10-CM

## 2021-08-25 DIAGNOSIS — M25572 Pain in left ankle and joints of left foot: Secondary | ICD-10-CM

## 2021-08-25 NOTE — Patient Instructions (Signed)
For inserts I like power steps, super feet, aetrex  Posterior Tibial Tendon Tear Rehab Ask your health care provider which exercises are safe for you. Do exercises exactly as told by your health care provider and adjust them as directed. It is normal to feel mild stretching, pulling, tightness, or discomfort as you do these exercises. Stop right away if you feel sudden pain or your pain gets worse. Do not begin these exercises until told by your health care provider. Stretching and range-of-motion exercises These exercises warm up your muscles and joints and improve the movement and flexibility of your ankle. These exercises also help to relieve pain, numbness, and tingling. Gastroc stretch  Sit on the floor with your left / right leg extended. Loop a belt or towel around ball of your left / right foot. The ball of your foot is on the walking surface, right under your toes. Keep your left / right ankle and foot relaxed and keep your knee straight while you use the belt or towel to pull your foot and ankle toward you. You should feel a gentle stretch behind your calf or knee (gastrocnemius). Hold this position for __________ seconds. Repeat __________ times. Complete this exercise __________ times a day. Active ankle dorsiflexion and plantar flexion  Sit with your left / right knee straight or bent. Do not rest your foot on anything. Flex your left / right ankle to tilt the top of your foot toward your shin (dorsiflexion). Hold this position for __________ seconds. Point your toes downward to tilt the top of your foot away from your shin (plantar flexion). Hold this position for __________ seconds. Repeat __________ times with your knee straight and __________ times with your knee bent. Complete this exercise __________ times a day. Passive ankle plantar flexion  Sit with your left / right leg crossed over your opposite knee. With your left / right hand, pull the front of your foot and toes  toward you (plantar flexion). You should feel a gentle stretch on the top of your foot and ankle. Hold this position for __________ seconds. Repeat __________ times. Complete this exercise __________ times a day. Passive ankle eversion  Sit with your left / right ankle crossed over your opposite knee. With your left / right hand, hold your foot so that your thumb is on the top of your foot and your fingers are on the bottom of your foot. Gently push and twist your ankle downward (eversion) so the smallest toes rise slightly toward the ceiling. You should feel a gentle stretch on the inside of your ankle. Hold this stretch for __________ seconds. Repeat __________ times. Complete this exercise __________ times a day. Passive ankle inversion  Sit with your left / right ankle crossed over your opposite knee. With your left / right hand, hold your foot so that your thumb is on the bottom of your foot and your fingers are across the top of your foot. Gently pull and twist your foot so the smallest toe comes toward you (inversion). You should feel a gentle stretch on the outside of your ankle. Hold the stretch for __________ seconds. Repeat __________ times. Complete this exercise __________ times a day. Ankle alphabet  Sit with your left / right leg supported at the lower leg. Do not rest your foot on anything. Make sure your foot has room to move freely. Think of your left / right foot as a paintbrush, and move your foot to trace each letter of the alphabet in  the air. Keep your hip and knee still while you trace. Trace every letter of the alphabet. Repeat __________ times. Complete this exercise __________ times a day. Strengthening exercises These exercises build strength and endurance in your lower leg. Endurance is the ability to use your muscles for a long time, even after they get tired. Dorsiflexion  Secure a rubber exercise band or tube to an object that will not move if it is pulled  on, such as a table leg. Secure the other end of the band around your left / right foot. Sit on the floor, facing the object with your left / right leg extended. The band or tube should be slightly tense when your foot is relaxed. Slowly flex your left / right ankle and toes to bring your foot toward you (dorsiflexion). Hold this position for __________ seconds. Let the band or tube slowly pull your foot back to the starting position. Repeat __________ times. Complete this exercise __________ times a day. Plantar flexion while seated  Sit on the floor with your left / right leg extended. Loop a rubber exercise band or tube around the ball of your left / right foot. The ball of your foot is on the walking surface, right under your toes. The band or tube should be slightly tense when your foot is relaxed. Slowly point your toes downward, pushing them away from you (plantar flexion). Hold this position for __________ seconds. Let the band or tube slowly pull your foot back to the starting position. Repeat __________ times. Complete this exercise __________ times a day. Towel curls  Sit in a chair on a non-carpeted surface, and put your feet on the floor. Place a towel in front of your feet. If told by your health care provider, add __________ to the end of the towel. Keeping your heel on the floor, put your left / right foot on the towel. Pull the towel toward you by grabbing the towel with your toes and curling them under. Keep your heel on the floor. Repeat __________ times. Complete this exercise __________ times a day. This information is not intended to replace advice given to you by your health care provider. Make sure you discuss any questions you have with your health care provider. Document Revised: 07/12/2018 Document Reviewed: 05/08/2018 Elsevier Patient Education  [place].

## 2021-08-28 NOTE — Progress Notes (Signed)
Subjective:   Patient ID: Claudia Hendrix, female   DOB: 46 y.o.   MRN: [id number]   HPI 46 year old female presents the office today for concerns of left foot and ankle discomfort.  History about 2 years ago and she has pain in the back of her foot and ankle on the medial aspect.  She previously had 1 injection which did help as well as a cam boot.  No recent injuries that she reports.  She also gets some discomfort when she brings her big toe down on the left foot.  Minimal edema noted.  No recent injuries that she reports.   Review of Systems  All other systems reviewed and are negative.  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    Asthma 05/2013   Environmental allergies    Fibroid, uterine    Wears glasses     Past Surgical History:  Procedure Laterality Date   CESAREAN SECTION  2011   With fibroid removal   CYSTOSCOPY N/A 03/29/2017   Procedure: CYSTOSCOPY;  Surgeon: Arvella Nigh, MD;  Location: [facility];  Service: Gynecology;  Laterality: N/A;   LAPAROSCOPIC TUBAL LIGATION Left 12/11/2014   Procedure: LAPAROSCOPIC TUBAL LIGATION;  Surgeon: Arvella Nigh, MD;  Location: [facility];  Service: Gynecology;  Laterality: Left;   LAPAROSCOPIC TUBAL LIGATION Bilateral 01/22/2015   Procedure: LAPAROSCOPIC TUBAL LIGATION;  Surgeon: Arvella Nigh, MD;  Location: [facility];  Service: Gynecology;  Laterality: Bilateral;   LAPAROSCOPIC VAGINAL HYSTERECTOMY WITH SALPINGECTOMY Bilateral 03/29/2017   Procedure: LAPAROSCOPIC ASSISTED VAGINAL HYSTERECTOMY WITH SALPINGECTOMY;  Surgeon: Arvella Nigh, MD;  Location: [facility];  Service: Gynecology;  Laterality: Bilateral;   LIPOMA EXCISION     removal of bone spur from right elbow      TUBAL LIGATION     wrist cyst     WRIST SURGERY       Current Outpatient Medications:    albuterol (PROVENTIL) (2.5 MG/3ML) 0.083% nebulizer solution, USE 1 VIAL IN NEBULIZER EVERY 6 HOURS AS NEEDED FOR WHEEZING OR SHORTNESS OF BREATH,  Disp: 75 mL, Rfl: 1   albuterol (VENTOLIN HFA) 108 (90 Base) MCG/ACT inhaler, Inhale 2 puffs into the lungs every 6 (six) hours as needed for wheezing., Disp: 1 each, Rfl: 1   celecoxib (CELEBREX) 200 MG capsule, Take 1 capsule (200 mg total) by mouth 2 (two) times daily between meals as needed., Disp: 60 capsule, Rfl: 3   Fluticasone-Salmeterol,sensor, (AIRDUO DIGIHALER) 232-14 MCG/ACT AEPB, Inhale 1 puff into the lungs daily., Disp: 1 each, Rfl: 2   montelukast (SINGULAIR) 10 MG tablet, Take 1 tablet (10 mg total) by mouth at bedtime., Disp: 30 tablet, Rfl: 3  No Known Allergies        Objective:  Physical Exam  General: AAO x3, NAD  Dermatological: Skin is warm, dry and supple bilateral.  There are no open sores, no preulcerative lesions, no rash or signs of infection present.  Vascular: Dorsalis Pedis artery and Posterior Tibial artery pedal pulses are 2/4 bilateral with immedate capillary fill time. There is no pain with calf compression, swelling, warmth, erythema.   Neruologic: Grossly intact via light touch bilateral.  Negative Tinel sign.  Musculoskeletal: There is tenderness to palpation along the medial aspect of the ankle on the flexor tendons.  There is minimal edema.  There is no erythema or warmth.  She is able to do a single and double heel rise.  Mild discomfort  with hallux plantarflexion.  Unable to elicit any area of pinpoint tenderness.  Decreased medial arch upon weightbearing.  Muscular strength 5/5 in all groups tested bilateral.  Gait: Unassisted, Nonantalgic.       Assessment:   Flexor tendinitis, posterior tibial tendon dysfunction     Plan:  -Treatment options discussed including all alternatives, risks, and complications -Etiology of symptoms were discussed -X-rays were obtained and reviewed with the patient.  3 views of the left foot and ankle were obtained.  No evidence of acute fracture. -I would recommend holding off on steroid injections  particularly along the course of the flexor tendons.  We discussed for medications but she wants to try to keep it natural.  Discussed turmeric and topical medications to be helpful as well.  We discussed stretching, rehab exercises as well as wearing shoes and good arch support.  She can use the cam boot if needed.  We discussed custom inserts which she can start over-the-counter ones.  Trula Slade DPM

## 2021-08-31 ENCOUNTER — Other Ambulatory Visit: Payer: Self-pay | Admitting: Podiatry

## 2021-08-31 DIAGNOSIS — M76822 Posterior tibial tendinitis, left leg: Secondary | ICD-10-CM

## 2022-01-10 ENCOUNTER — Encounter: Payer: Self-pay | Admitting: Internal Medicine

## 2022-04-02 ENCOUNTER — Other Ambulatory Visit: Payer: Self-pay | Admitting: Medical

## 2022-04-04 NOTE — Telephone Encounter (Signed)
Pt was advised that she needed to schedule an appt before anymore refills can be filled. Pt will call back to schedule once she looks at her calendar

## 2022-07-10 ENCOUNTER — Other Ambulatory Visit: Payer: Self-pay | Admitting: Medical

## 2022-07-10 ENCOUNTER — Telehealth: Payer: Self-pay | Admitting: Medical

## 2022-07-10 ENCOUNTER — Telehealth (INDEPENDENT_AMBULATORY_CARE_PROVIDER_SITE_OTHER): Payer: No Typology Code available for payment source | Admitting: Medical

## 2022-07-10 VITALS — BP 120/70 | HR 98 | Temp 99.2°F | Resp 18 | Wt 167.0 lb

## 2022-07-10 DIAGNOSIS — R509 Fever, unspecified: Secondary | ICD-10-CM | POA: Diagnosis not present

## 2022-07-10 DIAGNOSIS — J988 Other specified respiratory disorders: Secondary | ICD-10-CM | POA: Diagnosis not present

## 2022-07-10 DIAGNOSIS — R059 Cough, unspecified: Secondary | ICD-10-CM

## 2022-07-10 DIAGNOSIS — R058 Other specified cough: Secondary | ICD-10-CM

## 2022-07-10 MED ORDER — ALBUTEROL SULFATE HFA 108 (90 BASE) MCG/ACT IN AERS: INHALATION_SPRAY | RESPIRATORY_TRACT | 1 refills | Status: DC

## 2022-07-10 MED ORDER — BENZONATATE 200 MG PO CAPS: 200.0000 mg | ORAL_CAPSULE | Freq: Three times a day (TID) | ORAL | 0 refills | Status: DC | PRN

## 2022-07-10 MED ORDER — AZITHROMYCIN 250 MG PO TABS: ORAL_TABLET | ORAL | 0 refills | Status: DC

## 2022-07-10 MED ORDER — HYDROCODONE BIT-HOMATROP MBR 5-1.5 MG/5ML PO SOLN: 5.0000 mL | Freq: Three times a day (TID) | ORAL | 0 refills | Status: DC | PRN

## 2022-07-10 MED ORDER — HYDROCOD POLI-CHLORPHE POLI ER 10-8 MG/5ML PO SUER: 5.0000 mL | Freq: Two times a day (BID) | ORAL | 0 refills | Status: DC

## 2022-07-10 NOTE — Progress Notes (Signed)
Subjective:     Patient ID: Claudia Hendrix, female   DOB: June 18, 1975, 47 y.o.   MRN: [id number]  This visit type was conducted due to national recommendations for restrictions regarding the COVID-19 Pandemic (e.g. social distancing) in an effort to limit this patient's exposure and mitigate transmission in our community.  Due to their co-morbid illnesses, this patient is at least at moderate risk for complications without adequate follow up.  This format is felt to be most appropriate for this patient at this time.    Documentation for virtual audio and video telecommunications through [place] encounter:  The patient was located at home. The provider was located in the office. The patient did consent to this visit and is aware of possible charges through their insurance for this visit.  The other persons participating in this telemedicine service were none. Time spent on call was 20 minutes and in review of previous records 20 minutes total.  This virtual service is not related to other E/M service within previous 7 days.   HPI Chief Complaint  Patient presents with   cough, tightness in chest    Coughing and tightness in chest- since Friday- burning sensation, yellow mucous and this morning had blood.    Here for cough, phlegm, started 3 days ago, chest sore, burning sensation in chest.  Getting some mucous production.  No sinus or head pressure.   Had some sore throat x 2 days.  But that is a little better.  Mainly cough and bad chest discomfort.   No SOB or wheezing.  Been using inhaler and nebs to loosen mucous.  Coughed all night long last night.  No fever. No body aches but had some chills.  No sick contacts.  Did covid test today that was negative.   Using some mucinex OTC.   No other aggravating or relieving factors. No other complaint.   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 (Patient not taking: Reported on 07/10/2022) 1 each 2   No current facility-administered medications on file prior to visit.    Review of Systems As in subjective    Objective:   Physical Exam Due to coronavirus pandemic stay at home measures, patient visit was virtual and they were not examined in person.   BP 120/70   Pulse 98   Temp 99.2 F (37.3 C)   Resp 18   Wt 167 lb (75.8 kg)   LMP 03/29/2017   SpO2 97%   BMI 33.73 kg/m   Gen: wd, wn, nad Skin: Warm, mildly diaphoretic HEENT: Flat TMs, otherwise unremarkable, Neck supple, nontender, no lymphadenopathy Lungs: Somewhat decreased sounds in the right lower field otherwise clear, no wheezes or rales No obvious edema       Assessment:     Encounter Diagnoses  Name Primary?   Cough productive of purulent sputum Yes   Respiratory tract infection    Fever, unspecified fever cause    Cough, unspecified type  Plan:     We discussed symptoms and concerns.  I asked her to come up to our office in person so I can examine her.  Addendum: She came to our office for in person evaluation.  Vital signs reviewed, patient examined.  Begin medication as below, rest, hydrate well, discussed quarantine and prevention of spread to others.  She declines chest x-ray today.  We discussed usual timeframe to see improvements.  If worse or not much improved over the next 4 to 5 days then let me know  Claudia Hendrix was seen today for cough, tightness in chest.  Diagnoses and all orders for this visit:  Cough productive of purulent sputum  Respiratory tract  infection  Fever, unspecified fever cause  Cough, unspecified type  Other orders -     albuterol (VENTOLIN HFA) 108 (90 Base) MCG/ACT inhaler; INHALE 2 PUFFS BY MOUTH EVERY 6 HOURS AS NEEDED FOR WHEEZING -     HYDROcodone bit-homatropine (HYCODAN) 5-1.5 MG/5ML syrup; Take 5 mLs by mouth every 8 (eight) hours as needed for up to 5 days for cough. -     azithromycin (ZITHROMAX) 250 MG tablet; 2 tablets day 1, then 1 tablet days 2-4  F/u prn

## 2022-07-10 NOTE — Telephone Encounter (Signed)
Pt called back and states that the medication you sent was $30 and not covered by her insurance. She wants to know if you can send in anything else not so expensive. Pt uses [facility] and can be reached at [phone]

## 2022-07-10 NOTE — Telephone Encounter (Signed)
Pharmacy called and states that the RX hycodan syrup is out of stock has been on national back order  Please send different medicine to the  [id number] - [place] ([place]), [place] - [address]

## 2022-07-11 ENCOUNTER — Other Ambulatory Visit: Payer: Self-pay | Admitting: Medical

## 2022-07-11 ENCOUNTER — Ambulatory Visit
Admission: RE | Admit: 2022-07-11 | Discharge: 2022-07-11 | Disposition: A | Discharge status: Home / Self Care | Payer: No Typology Code available for payment source | Source: Ambulatory Visit | Attending: Medical | Admitting: Medical

## 2022-07-11 ENCOUNTER — Telehealth: Payer: Self-pay | Admitting: Internal Medicine

## 2022-07-11 DIAGNOSIS — R051 Acute cough: Secondary | ICD-10-CM

## 2022-07-11 DIAGNOSIS — R509 Fever, unspecified: Secondary | ICD-10-CM

## 2022-07-11 NOTE — Telephone Encounter (Signed)
Pt called and would like an chest xray now.

## 2022-07-11 NOTE — Telephone Encounter (Signed)
Pt was notified.  

## 2022-07-11 NOTE — Progress Notes (Signed)
No obvious pneumonia.  Continue medication recommendations and treatment recommendations as discussed yesterday

## 2022-08-07 ENCOUNTER — Telehealth: Payer: Self-pay | Admitting: Medical

## 2022-08-07 MED ORDER — AIRDUO DIGIHALER 232-14 MCG/ACT IN AEPB: 1.0000 | INHALATION_SPRAY | Freq: Every day | RESPIRATORY_TRACT | 2 refills | Status: DC

## 2022-08-07 NOTE — Telephone Encounter (Signed)
Please call she has questions about her inhaler.  She wants to know about the Airduo, when she was on that she didn't have to use very often but the one she has now, she is having to use frequently  She thought it was her rescue inhaler

## 2022-08-07 NOTE — Telephone Encounter (Signed)
Left message for pt to call me back 

## 2022-08-07 NOTE — Telephone Encounter (Signed)
Pt had been receiving a sample of the airduo inhaler and needed rx for it now.

## 2022-08-09 ENCOUNTER — Telehealth: Payer: Self-pay

## 2022-08-09 ENCOUNTER — Other Ambulatory Visit: Payer: Self-pay | Admitting: Medical

## 2022-08-09 MED ORDER — AIRDUO DIGIHALER 232-14 MCG/ACT IN AEPB: 1.0000 | INHALATION_SPRAY | Freq: Every day | RESPIRATORY_TRACT | 2 refills | Status: DC

## 2022-08-09 NOTE — Telephone Encounter (Signed)
She said the AirDuo inhaler isn't helping her. Can you send in something else?

## 2022-08-09 NOTE — Telephone Encounter (Signed)
Pt called and advised she is needing a refill of her non-rescue inhaler. The rescue inhaler is bothering her breathing and causing her to need to use it more often than when she was using the non-rescue inhaler. She said the non-rescue inhaler may be an old prescription from earlier this year or last year. I went through the list of her previous prescriptions but she did not know the name of the inhaler she is requesting. Only that it is non-rescue.

## 2022-08-10 ENCOUNTER — Telehealth: Payer: Self-pay | Admitting: Medical

## 2022-08-10 ENCOUNTER — Other Ambulatory Visit: Payer: Self-pay | Admitting: Medical

## 2022-08-10 MED ORDER — FLUTICASONE-SALMETEROL 115-21 MCG/ACT IN AERO: 2.0000 | INHALATION_SPRAY | Freq: Two times a day (BID) | RESPIRATORY_TRACT | 1 refills | Status: DC

## 2022-08-10 MED ORDER — TRELEGY ELLIPTA 100-62.5-25 MCG/ACT IN AEPB: 1.0000 | INHALATION_SPRAY | Freq: Every day | RESPIRATORY_TRACT | 0 refills | Status: DC

## 2022-08-10 NOTE — Telephone Encounter (Signed)
Per [place] will give her sample of Trelegy 100 mcg use 1 puff qd & rinse mouth out while waiting until pt lets [place] know about what is covered by insurance.

## 2022-08-10 NOTE — Telephone Encounter (Signed)
Pt called back & I informed her of sample & she will pick up today.  She called insurance and Breo ellipta is covered for $40 a month.  Please send in [place]

## 2022-08-10 NOTE — Telephone Encounter (Signed)
Pt came by and it ARNUITY ELLIPTA is covered for $40 not Breo Ellipta that is covered

## 2022-08-10 NOTE — Telephone Encounter (Signed)
Please see last telephone call  I accidentally closed it,  please send in [facility]

## 2022-08-10 NOTE — Telephone Encounter (Signed)
Pt.notified

## 2022-08-10 NOTE — Telephone Encounter (Signed)
Pt called coughing every other word, states Advair isn't covered either & will cost too much.  I told her to call insurance company & she says she can't until after work.  Do we have any samples of any inhalers to give her, until we can get this figured out, she sounds miserable?

## 2022-08-11 ENCOUNTER — Other Ambulatory Visit: Payer: Self-pay | Admitting: Medical

## 2022-08-11 ENCOUNTER — Telehealth: Payer: Self-pay

## 2022-08-11 MED ORDER — FLUTICASONE FUROATE-VILANTEROL 100-25 MCG/ACT IN AEPB: 1.0000 | INHALATION_SPRAY | Freq: Every day | RESPIRATORY_TRACT | 2 refills | Status: DC

## 2022-08-11 NOTE — Telephone Encounter (Signed)
Pt called to advise she is going to purchase primatene mist and use that instead of the inhaler. She said the trelegy was similar to the inhaler she was previously prescribed. I advised her breo ellipta was sent today. She said she would see how much it costs and call us back to let us know which med she will take.

## 2022-10-31 ENCOUNTER — Other Ambulatory Visit: Payer: Self-pay | Admitting: Obstetrics and Gynecology

## 2022-10-31 DIAGNOSIS — N6310 Unspecified lump in the right breast, unspecified quadrant: Secondary | ICD-10-CM

## 2022-11-20 ENCOUNTER — Ambulatory Visit
Admission: RE | Admit: 2022-11-20 | Discharge: 2022-11-20 | Disposition: A | Discharge status: Home / Self Care | Payer: No Typology Code available for payment source | Source: Ambulatory Visit | Attending: Obstetrics and Gynecology | Admitting: Obstetrics and Gynecology

## 2022-11-20 DIAGNOSIS — N6310 Unspecified lump in the right breast, unspecified quadrant: Secondary | ICD-10-CM

## 2023-01-31 ENCOUNTER — Ambulatory Visit (INDEPENDENT_AMBULATORY_CARE_PROVIDER_SITE_OTHER): Payer: No Typology Code available for payment source | Admitting: Medical

## 2023-01-31 ENCOUNTER — Encounter: Payer: Self-pay | Admitting: Medical

## 2023-01-31 VITALS — BP 122/82 | HR 84 | Temp 98.0°F | Wt 177.0 lb

## 2023-01-31 DIAGNOSIS — M62830 Muscle spasm of back: Secondary | ICD-10-CM | POA: Diagnosis not present

## 2023-01-31 DIAGNOSIS — M25512 Pain in left shoulder: Secondary | ICD-10-CM

## 2023-01-31 DIAGNOSIS — M19019 Primary osteoarthritis, unspecified shoulder: Secondary | ICD-10-CM

## 2023-01-31 DIAGNOSIS — M25511 Pain in right shoulder: Secondary | ICD-10-CM
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
## 2023-02-06 ENCOUNTER — Encounter: Payer: Self-pay | Admitting: Family Medicine

## 2023-02-06 ENCOUNTER — Ambulatory Visit (INDEPENDENT_AMBULATORY_CARE_PROVIDER_SITE_OTHER): Payer: No Typology Code available for payment source | Admitting: Family Medicine

## 2023-02-06 VITALS — BP 128/76 | Ht 59.0 in | Wt 180.0 lb

## 2023-02-06 DIAGNOSIS — M25511 Pain in right shoulder: Secondary | ICD-10-CM

## 2023-02-06 DIAGNOSIS — M25512 Pain in left shoulder: Secondary | ICD-10-CM | POA: Diagnosis not present

## 2023-02-06 DIAGNOSIS — G8929 Other chronic pain: Secondary | ICD-10-CM | POA: Diagnosis not present

## 2023-02-06 NOTE — Progress Notes (Signed)
PCP: Jac Canavan, PA-C  Subjective:  CC: b/l shoulder pain HPI: Patient is a 47 y.o. female here for B/l shoulder and upper back pain x28yrs. RHD  Pain is intermittent, multiple times per week, typically sharp through b/l shoulders and through upper back/neck. Denies shooting pain down arms, numbness, tingling in fingers Usually notices it in the mornings and finds that her shoulders/neck are stiff. Occasionally occurs after prolonged overhead reaching Symptoms typically resolve on their own. She has not tried any medications or specific exercises for her symptoms. 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 60 each 2   No current facility-administered medications on file prior to visit.    Past Surgical History:  Procedure Laterality Date   CESAREAN SECTION  2011   With fibroid removal   CYSTOSCOPY N/A 03/29/2017   Procedure: CYSTOSCOPY;  Surgeon: Richardean Chimera, MD;  Location: [facility];  Service: Gynecology;  Laterality: N/A;   LAPAROSCOPIC TUBAL LIGATION Left 12/11/2014   Procedure: LAPAROSCOPIC TUBAL LIGATION;  Surgeon: Richardean Chimera, MD;  Location: [facility];  Service: Gynecology;  Laterality: Left;   LAPAROSCOPIC TUBAL LIGATION Bilateral 01/22/2015   Procedure: LAPAROSCOPIC TUBAL LIGATION;  Surgeon: Richardean Chimera, MD;  Location: [facility];  Service: Gynecology;  Laterality: Bilateral;   LAPAROSCOPIC VAGINAL HYSTERECTOMY WITH SALPINGECTOMY Bilateral 03/29/2017    Procedure: LAPAROSCOPIC ASSISTED VAGINAL HYSTERECTOMY WITH SALPINGECTOMY;  Surgeon: Richardean Chimera, MD;  Location: [facility];  Service: Gynecology;  Laterality: Bilateral;   LIPOMA EXCISION     removal of bone spur from right elbow      TUBAL LIGATION     wrist cyst     WRIST SURGERY      No Known Allergies      Objective:  BP 128/76   Ht 4\' 11"  (1.499 m)   Wt 180 lb (81.6 kg)   LMP 03/29/2017   BMI 36.36 kg/m   Physical Exam:  Gen: NAD, comfortable in exam room  B/l shoulders/neck: Inspection: No gross deformity, ecchymoses, swelling Palpation: Nontender to palpation throughout bilateral shoulders, cervical spine, and neck/upper back musculature Hypertonia of b/l trapezius ROM: Full range of motion with (+) painful arch of bilateral shoulders; neck with FROM without pain Strength: 5/5 strength bilateral shoulders and neck; normal grip strength b/l Special tests: Slight pain with empty can and Neer's bilaterally, negative Hawkins bilaterally, neg Speeds, neg Drop arm b/l  NEURO: sensation intact to light touch, DTR 2/4 bicep, tricep, brachioradialis b/l VASC: pulses 2+ radial artery b/l  Assessment & Plan Chronic pain of both shoulders Chronic b/l shoulder pain with likely RTC tendinopathy and associated mild muscle spasms - Low suspicion for significant tear or radiculopathy, adhesive capsulitis given overall reassuring exam.   PLAN: - Upon shared decision making patient prefers conservative measures such as home exercises rather than pursuing medication therapy and/or formal physical therapy.  - Provided home exercises.  - F/u in 4-6 weeks or sooner if needed.  Consider ultrasound and/or xrays if not improving  Addendum:  Patient seen in the office with resident - Vonna Drafts, MD, PGY-2 today.  History, exam, plan of care were precepted with me.  Edits and additions made to note above.  Agree with findings as noted in resident note above.  Darene Lamer, DO, CAQSM

## 2023-03-26 ENCOUNTER — Telehealth (INDEPENDENT_AMBULATORY_CARE_PROVIDER_SITE_OTHER): Payer: No Typology Code available for payment source | Admitting: Medical

## 2023-03-26 VITALS — Wt 178.0 lb

## 2023-03-26 DIAGNOSIS — R058 Other specified cough: Secondary | ICD-10-CM | POA: Diagnosis not present

## 2023-03-26 DIAGNOSIS — J4541 Moderate persistent asthma with (acute) exacerbation: Secondary | ICD-10-CM

## 2023-03-26 MED ORDER — AZITHROMYCIN 250 MG PO TABS: ORAL_TABLET | ORAL | 0 refills | Status: DC

## 2023-03-26 MED ORDER — ALBUTEROL SULFATE (2.5 MG/3ML) 0.083% IN NEBU: INHALATION_SOLUTION | RESPIRATORY_TRACT | 1 refills | Status: DC

## 2023-03-26 MED ORDER — ALBUTEROL SULFATE HFA 108 (90 BASE) MCG/ACT IN AERS: INHALATION_SPRAY | RESPIRATORY_TRACT | 1 refills | Status: DC

## 2023-03-26 MED ORDER — PREDNISONE 20 MG PO TABS: ORAL_TABLET | ORAL | 0 refills | Status: DC

## 2023-03-26 NOTE — Progress Notes (Signed)
Subjective:     Patient ID: Claudia Hendrix, female   DOB: 10-May-1975, 47 y.o.   MRN: [id number]  This visit type was conducted due to national recommendations for restrictions regarding the COVID-19 Pandemic (e.g. social distancing) in an effort to limit this patient's exposure and mitigate transmission in our community.  Due to their co-morbid illnesses, this patient is at least at moderate risk for complications without adequate follow up.  This format is felt to be most appropriate for this patient at this time.    Documentation for virtual audio and video telecommunications through [place] encounter:  The patient was located at home. The provider was located in the office. The patient did consent to this visit and is aware of possible charges through their insurance for this visit.  The other persons participating in this telemedicine service were none. Time spent on call was 20 minutes and in review of previous records 20 minutes total.  This virtual service is not related to other E/M service within previous 7 days.   HPI Chief Complaint  Patient presents with   Cough    Cough and congestion x 2-3 weeks. No other symptoms   She notes cough, phlegm coming up, going on 2-3 weeks.   Had sore throat but that resolved.  No fever, no body ache or chills.  Sometimes thick yellowish phlegm.  No ear pain.  Had some sinus pressure.  Feels wheezing at times.  However does not feel particular short of breath  Using albuterol rescue inhaler some, maybe twice in the past week.   Has used nebulizer some for albuterol.   No runny nose, no itchy eyes.   Some occasional sneezing.    Has not been using anything over-the-counter for symptoms.  She is using the albuterol some.  No other aggravating or relieving factors. No other complaint.   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   No current facility-administered medications on file prior to visit.    Review of Systems As in subjective    Objective:   Physical Exam Due to coronavirus pandemic stay at home measures, patient visit was virtual and they were not examined in person.   Wt 178 lb (80.7 kg)   LMP 03/29/2017   BMI 35.95 kg/m   Gen: wd, wn, nad No audible wheezing or labored breathing      Assessment:     Encounter Diagnoses  Name Primary?   Moderate persistent extrinsic asthma with acute exacerbation Yes   Cough productive of purulent sputum        Plan:     Advise rest, hydration, begin over-the-counter Mucinex or Sudafed the next 3 to 5 days, begin Z-Pak and prednisone short course below.  Continue albuterol as needed.  Discussed proper use of albuterol and rescue versus preventative inhaler.  Symptoms are still mild intermittent asthma at this point.  If not much improved within the next week, then let me know  Ermalinda was seen today for cough.  Diagnoses and all orders for this visit:  Moderate persistent extrinsic asthma with acute exacerbation  Cough productive of purulent sputum  Other orders -     azithromycin (ZITHROMAX) 250 MG tablet; 2 tablets day 1, then 1 tablet days  2-4 -     predniSONE (DELTASONE) 20 MG tablet; 3 tablets today, 2 tablets tomorrow, 1 tablet the third day -     albuterol (PROVENTIL) (2.5 MG/3ML) 0.083% nebulizer solution; USE 1 VIAL IN NEBULIZER EVERY 6 HOURS AS NEEDED FOR WHEEZING OR SHORTNESS OF BREATH -     albuterol (VENTOLIN HFA) 108 (90 Base) MCG/ACT inhaler; INHALE 2 PUFFS BY MOUTH EVERY 6 HOURS AS NEEDED FOR WHEEZING    Follow up as needed

## 2023-05-31 ENCOUNTER — Encounter: Payer: Self-pay | Admitting: Family Medicine

## 2023-05-31 ENCOUNTER — Telehealth: Payer: No Typology Code available for payment source | Admitting: Family Medicine

## 2023-05-31 ENCOUNTER — Ambulatory Visit: Payer: Self-pay | Admitting: Medical

## 2023-05-31 VITALS — Ht 59.0 in | Wt 180.0 lb

## 2023-05-31 DIAGNOSIS — J4520 Mild intermittent asthma, uncomplicated: Secondary | ICD-10-CM | POA: Diagnosis not present

## 2023-05-31 MED ORDER — PREDNISONE 20 MG PO TABS
ORAL_TABLET | ORAL | 0 refills | Status: DC
Start: 2023-05-31 — End: 2023-08-13

## 2023-05-31 NOTE — Progress Notes (Signed)
   Subjective:    Patient ID: Claudia Hendrix, female    DOB: 1975/05/30, 48 y.o.   MRN: [id number]  HPI Documentation for virtual audio and video telecommunications through [facility] encounter: The patient was located at home. 2 patient identifiers used.  The provider was located in the office. The patient did consent to this visit and is aware of possible charges through their insurance for this visit. The other persons participating in this telemedicine service were none. Time spent on call was 5 minutes and in review of previous records >15 minutes total for counseling and coordination of care. This virtual service is not related to other E/M service within previous 7 days.  She has a history of intermittent asthma and recently was using essential oils to clean her house and it precipitated an attack.  She has been using albuterol inhaler but only 1 puff at a time.  Review of Systems     Objective:    Physical Exam Alert and coughing.       Assessment & Plan:  Mild intermittent asthma without complication - Plan: predniSONE (DELTASONE) 20 MG tablet I instructed her to use 2 puffs with each puff being separate from the other 1 and to use a spacer to help get the medicine into her lungs better.  If she is not successful doing that I then told her that I would call in the prednisone which has been given to her in the past that she can pick up if using the inhaler is not effective.  She was comfortable with that.

## 2023-05-31 NOTE — Telephone Encounter (Signed)
  Chief Complaint: SOB Symptoms: SOB, cough Frequency: began yesterday Pertinent Negatives: Patient denies CP, fever Disposition: [x] ED /[] Urgent Care (no appt availability in office) / [] Appointment(In office/virtual)/ []  [place] [facility]/ [] Home Care/ [x] Refused Recommended Disposition /[] [place] [facility]/ []  Follow-up with PCP Additional Notes: Patient calls reporting worsening SOB that began yesterday while at work, states she believes she mixed essential oils together that triggered the SOB. Patient is struggling to speak in full sentences. States her inhaler is not helping and she has completed a breathing treatment. Per protocol, patient to present to ED now for evaluation based of symptoms. Care advice reviewed, patient refused disposition. This RN contacted [facility], spoke with Vernona Rieger, who advised they will call the patient back to and see if they can assist. Alerting PCP for review and follow up.    Copied from CRM [id number]. Topic: Clinical - Red Word Triage >> May 31, 2023  8:12 AM Elle L wrote: Red Word that prompted transfer to Nurse Triage: The patient is having difficulty breathing and her inhaler is not working. Reason for Disposition  [1] MODERATE difficulty breathing (e.g., speaks in phrases, SOB even at rest, pulse 100-120) AND [2] NEW-onset or WORSE than normal  Answer Assessment - Initial Assessment Questions 1. RESPIRATORY STATUS: "Describe your breathing?" (e.g., wheezing, shortness of breath, unable to speak, severe coughing)      SOB, coughing 2. ONSET: "When did this breathing problem begin?"      Yesterday- thinks she missed essential oils together 3. PATTERN "Does the difficult breathing come and go, or has it been constant since it started?"      Constant 4. SEVERITY: "How bad is your breathing?" (e.g., mild, moderate, severe)    - MILD: No SOB at rest, mild SOB with walking, speaks normally in sentences, can lie down, no retractions, pulse < 100.    -  MODERATE: SOB at rest, SOB with minimal exertion and prefers to sit, cannot lie down flat, speaks in phrases, mild retractions, audible wheezing, pulse 100-120.    - SEVERE: Very SOB at rest, speaks in single words, struggling to breathe, sitting hunched forward, retractions, pulse > 120      Moderate 5. RECURRENT SYMPTOM: "Have you had difficulty breathing before?" If Yes, ask: "When was the last time?" and "What happened that time?"      Yes,  6. CARDIAC HISTORY: "Do you have any history of heart disease?" (e.g., heart attack, angina, bypass surgery, angioplasty)      Denies 7. LUNG HISTORY: "Do you have any history of lung disease?"  (e.g., pulmonary embolus, asthma, emphysema)     Asthma 8. CAUSE: "What do you think is causing the breathing problem?"      Mixed chemicals 9. OTHER SYMPTOMS: "Do you have any other symptoms? (e.g., dizziness, runny nose, cough, chest pain, fever)     Denies 10. O2 SATURATION MONITOR:  "Do you use an oxygen saturation monitor (pulse oximeter) at home?" If Yes, ask: "What is your reading (oxygen level) today?" "What is your usual oxygen saturation reading?" (e.g., 95%)       Unable to check 11. PREGNANCY: "Is there any chance you are pregnant?" "When was your last menstrual period?"       Denies 12. TRAVEL: "Have you traveled out of the country in the last month?" (e.g., travel history, exposures)       Denies  Protocols used: Breathing Difficulty-A-AH

## 2023-07-13 ENCOUNTER — Other Ambulatory Visit: Payer: Self-pay | Admitting: Medical

## 2023-07-13 ENCOUNTER — Telehealth: Payer: Self-pay | Admitting: Internal Medicine

## 2023-07-13 MED ORDER — FLUTICASONE-SALMETEROL 115-21 MCG/ACT IN AERO: 2.0000 | INHALATION_SPRAY | Freq: Two times a day (BID) | RESPIRATORY_TRACT | 2 refills | Status: AC

## 2023-07-13 MED ORDER — ALBUTEROL SULFATE (2.5 MG/3ML) 0.083% IN NEBU: INHALATION_SOLUTION | RESPIRATORY_TRACT | 1 refills | Status: AC

## 2023-07-13 MED ORDER — ALBUTEROL SULFATE HFA 108 (90 BASE) MCG/ACT IN AERS: INHALATION_SPRAY | RESPIRATORY_TRACT | 1 refills | Status: DC

## 2023-07-13 NOTE — Telephone Encounter (Signed)
 Left detailed message for pt

## 2023-07-13 NOTE — Telephone Encounter (Signed)
 Copied from CRM [id number]. Topic: Clinical - Medication Question >> Jul 13, 2023  9:50 AM Pierre Bali B wrote: Reason for CRM: patient called in to see if she can get a preventive inhaler because she is using her rescue inhaler more than she should .  Saw Dr. Susann Givens in February

## 2023-08-07 ENCOUNTER — Telehealth: Payer: Self-pay | Admitting: Internal Medicine

## 2023-08-07 NOTE — Telephone Encounter (Signed)
 Copied from CRM [id number]. Topic: Referral - Request for Referral >> Aug 07, 2023  4:08 PM Everette C wrote: Did the patient discuss referral with their provider in the last year? No (If No - schedule appointment) (If Yes - send message)  Appointment offered? No  Type of order/referral and detailed reason for visit: Orthopedics / Rheumatology patient is having back and hand discomfort    Preference of office, provider, location: [facility]   If referral order, have you been seen by this specialty before? Yes (If Yes, this issue or another issue? When? Where? Patient has been to this facility previously   Can we respond through MyChart? No

## 2023-08-07 NOTE — Telephone Encounter (Signed)
 Left message for pt to call the office back to schedule a visit

## 2023-08-13 ENCOUNTER — Ambulatory Visit (INDEPENDENT_AMBULATORY_CARE_PROVIDER_SITE_OTHER): Admitting: Medical

## 2023-08-13 ENCOUNTER — Ambulatory Visit: Payer: Self-pay

## 2023-08-13 ENCOUNTER — Encounter: Payer: Self-pay | Admitting: Medical

## 2023-08-13 VITALS — BP 118/60 | HR 79 | Wt 186.6 lb

## 2023-08-13 DIAGNOSIS — G5603 Carpal tunnel syndrome, bilateral upper limbs: Secondary | ICD-10-CM

## 2023-08-13 DIAGNOSIS — J4521 Mild intermittent asthma with (acute) exacerbation: Secondary | ICD-10-CM | POA: Diagnosis not present

## 2023-08-13 DIAGNOSIS — J029 Acute pharyngitis, unspecified: Secondary | ICD-10-CM | POA: Diagnosis not present

## 2023-08-13 DIAGNOSIS — R059 Cough, unspecified: Secondary | ICD-10-CM | POA: Diagnosis not present

## 2023-08-13 DIAGNOSIS — J988 Other specified respiratory disorders: Secondary | ICD-10-CM

## 2023-08-13 MED ORDER — PSEUDOEPHEDRINE HCL 60 MG PO TABS: 60.0000 mg | ORAL_TABLET | Freq: Two times a day (BID) | ORAL | 0 refills | Status: DC

## 2023-08-13 MED ORDER — AMOXICILLIN-POT CLAVULANATE 875-125 MG PO TABS: 1.0000 | ORAL_TABLET | Freq: Two times a day (BID) | ORAL | 0 refills | Status: DC

## 2023-08-13 MED ORDER — BENZONATATE 200 MG PO CAPS: 200.0000 mg | ORAL_CAPSULE | Freq: Three times a day (TID) | ORAL | 0 refills | Status: DC | PRN

## 2023-08-13 NOTE — Progress Notes (Signed)
 Subjective:  Claudia Hendrix is a 48 y.o. female who presents for Chief Complaint  Patient presents with   Sore Throat    Sore throat, coughing, congestion, left ear pain, right eye drainage, no fever started a week ago, negative covid test today has taken tylenol , sore throat spray, and cough drops   Cough     Here for sore throat, cough, congestion, ear pains.  Was around sick contact last week.  Symptoms for right at a week.    No fever, no chills.  Some pains in chest with coughing, coughing quite a bit.   No SOB or wheezing. But hard cough hurts.  No NVD.    Using cough drops otc, OTC sore throat spray, and some nyquil.  Did covid test today that was negative.  Did Advair yesterday but has just been using prn.  Not having to use inhaler much.   She has questions about her last nerve conduction study for the wrist in 2023.  She still has symptoms but wants to avoid surgery or injections.  She works cleaning houses   No other aggravating or relieving factors.     No other c/o.  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 1 each 2   albuterol  (PROVENTIL ) (2.5 MG/3ML) 0.083% nebulizer solution USE 1 VIAL IN NEBULIZER EVERY 6 HOURS AS NEEDED FOR WHEEZING OR SHORTNESS OF BREATH (Patient not taking: Reported on 08/13/2023) 75 mL 1   No current facility-administered medications on file prior to visit.     The following portions of the patient's history were reviewed and updated as appropriate: allergies, current medications, past family history,  past medical history, past social history, past surgical history and problem list.  ROS Otherwise as in subjective above  Objective: BP 118/60   Pulse 79   Wt 186 lb 9.6 oz (84.6 kg)   LMP 03/29/2017   SpO2 95%   BMI 37.69 kg/m   General appearance: alert, no distress, well developed, well nourished HEENT: normocephalic, sclerae anicteric, conjunctiva pink and moist, TMs retracted bilaterally, mild erythema, nares with swollen turbinates and clear to mucoid  discharge, erythema, pharynx normal Oral cavity: MMM, no lesions Neck: supple, no lymphadenopathy, no thyromegaly, no masses Heart: RRR, normal S1, S2, no murmurs Lungs: Faint expiratory wheezes in the lower fields, somewhat decreased sounds in the right lower fields, otherwise no rhonchi, or rales Pulses: 2+ radial pulses, 2+ pedal pulses, normal cap refill Ext: no edema    Assessment: Encounter Diagnoses  Name Primary?   Cough, unspecified type Yes   Sore throat    Mild intermittent asthma with acute exacerbation    Bilateral carpal tunnel syndrome    Respiratory tract infection      Plan: Cough, sore throat, respiratory tract infection-begin Augmentin antibiotic, Sudafed as below for the next 5 to 7 days, Tessalon  Perles as needed for cough, advised increase water  intake and relative rest.  Asthma-we discussed proper use of Advair maintenance inhaler and  rescue albuterol  inhaler.  Increase albuterol  short-term this week to help with symptoms  We reviewed back over her March 2020 III nerve conduction studies that showed bilateral moderate carpal tunnel syndrome.  She is using night splints currently.  She declines referral to hand surgery center for consult for surgery.   [name] was seen today for sore throat and cough.  Diagnoses and all orders for this visit:  Cough, unspecified type  Sore throat  Mild intermittent asthma with acute exacerbation  Bilateral carpal tunnel syndrome  Respiratory tract  infection  Other orders -     amoxicillin-clavulanate (AUGMENTIN) 875-125 MG tablet; Take 1 tablet by mouth 2 (two) times daily. -     pseudoephedrine (SUDAFED) 60 MG tablet; Take 1 tablet (60 mg total) by mouth in the morning and at bedtime. -     benzonatate  (TESSALON ) 200 MG capsule; Take 1 capsule (200 mg total) by mouth 3 (three) times daily as needed for cough.   Follow up: prn

## 2023-08-13 NOTE — Telephone Encounter (Signed)
 Copied from CRM [id number]. Topic: Clinical - Red Word Triage >> Aug 13, 2023  7:40 AM Leory Rands wrote: Red Word that prompted transfer to Nurse Triage: Patient is calling to report sore throat, chest pain from coughing, blood specs in her phelm. Phelm is yellow. Sx for one week   Chief Complaint: Sore Throat   Symptoms: Sore Throat, Chest Soreness, Cough, Earache, Eye Crusting  Frequency: One Week Pertinent Negatives: Patient denies fever  Disposition: [] ED /[] Urgent Care (no appt availability in office) / [x] Appointment(In office/virtual)/ []  [place] [facility]/ [] Home Care/ [] Refused Recommended Disposition /[] [place] [facility]/ []  Follow-up with PCP Additional Notes: EW is being triaged for  a sore throat and accompanying symptoms.The patient denies any dyspnea. Reports a productive cough (yellow sputum), earache, and eye crusting upon awakening.   The patient has NOT tested for COVID or influenza recently.   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  In office appointment made for today with PCP.   Reason for Disposition  SEVERE (e.g., excruciating) throat pain  Answer Assessment - Initial Assessment Questions 1. ONSET: "When did the throat start hurting?" (Hours or days ago)      One Week  2. SEVERITY: "How bad is the sore throat?" (Scale 1-10; mild, moderate or severe)   - MILD (1-3):  Doesn't interfere with eating or normal activities.   - MODERATE (4-7): Interferes with eating some solids and normal activities.   - SEVERE (8-10):  Excruciating pain, interferes with most normal activities.   - SEVERE WITH DYSPHAGIA (10): Can't swallow liquids, drooling.     Moderate to Severe  3. STREP EXPOSURE: "Has there been any exposure to strep within the past week?" If Yes, ask: "What type of contact occurred?"      Customer was sick on Monday and exposure at that time.  4.  VIRAL SYMPTOMS: "Are there any symptoms of  a cold, such as a runny nose, cough, hoarse voice or red eyes?"      Cough, Earache, Earache  5. FEVER: "Do you have a fever?" If Yes, ask: "What is your temperature, how was it measured, and when did it start?"     No  6. PUS ON THE TONSILS: "Is there pus on the tonsils in the back of your throat?"     No  7. OTHER SYMPTOMS: "Do you have any other symptoms?" (e.g., difficulty breathing, headache, rash)     D  8. PREGNANCY: "Is there any chance you are pregnant?" "When was your last menstrual period?"     No  Protocols used: Sore Throat-A-AH

## 2023-08-23 ENCOUNTER — Ambulatory Visit: Payer: Self-pay

## 2023-08-23 NOTE — Telephone Encounter (Signed)
 Chief Complaint:  Vaginal  S/s: Patient has a new rash and  itching after using an antibiotic.    Symptoms:  -Itching: 6/10 -Pain: 6/10 -Rash ( "bumpy" and red) on labia majora, that extends to anus  Frequency:  Thursday    Patient denies  [facility], discharge, bleeding, numbness, odor.   Disposition: [ ] ED /[X ]Urgent Care (no appt availability in office) / [ ] Appointment(In office/virtual)/ [ ]  [place] [facility]/ [ ] Home Care/ [ ] Refused Recommended Disposition /[ ] [place] [facility]/ [ ]  Follow-up with PCP   Additional Notes:  -Patient was given antibiotic for her sinus infection, after noting the rash patient stopped the antibiotic on her own accord. Attempted to schedule an appt for the pt, none avaiable. Scheduled an UC appt appropriately. Patient educated on pertinent s/s that would warrant emergent help/call 911/ ED/ Patient verbalized understanding. No additional questions/concerns noted during the time of the call.     Complete triage note below:      Copied from CRM [id number]. Topic: Clinical - Prescription Issue >> Aug 23, 2023 10:28 AM Kevelyn M wrote: Reason for CRM: Antibiotic prescribed is causing and itch and rash in private area. Patient wants to know if she can get something over the counter or prescription. Reason for Disposition  MODERATE-SEVERE itching (i.e., interferes with school, work, or sleep)  Answer Assessment - Initial Assessment Questions 1. SYMPTOM: "What's the main symptom you're concerned about?" (e.g., pain, itching, dryness)  --- Itching, rash ( Clusters of bumps) - A& D and [employer].  --- Was given an antibiotic- Amoxicillin -Clav- ( sinus infection), and this reaction occurred.    2. LOCATION: "Where is the  located?" (e.g., inside/outside, left/right)     ----- Labia area, inner thighs and radiates to anus    3. ONSET: "When did the  start?"     ---- Thursday    4. PAIN: "Is there any pain?" If Yes, ask: "How bad is  it?" (Scale: 1-10; mild, moderate, severe) ---------  6/10    5. ITCHING: "Is there any itching?" If Yes, ask: "How bad is it?" (Scale: 1-10; mild, moderate, severe)     ---- 6/10 :Moderate    6. CAUSE: "What do you think is causing the discharge?" "Have you had the same problem before? What happened then?"    ----- Denies    7. OTHER SYMPTOMS: "Do you have any other symptoms?" (e.g., fever, itching, vaginal bleeding, pain with urination, injury to genital area, vaginal foreign body)     ---- Redness  Protocols used: Vaginal Symptoms-A-AH

## 2023-08-24 ENCOUNTER — Ambulatory Visit (HOSPITAL_COMMUNITY): Payer: Self-pay

## 2023-09-17 ENCOUNTER — Other Ambulatory Visit: Payer: Self-pay | Admitting: Medical

## 2023-09-25 LAB — COLOGUARD: COLOGUARD: NEGATIVE

## 2023-11-05 ENCOUNTER — Other Ambulatory Visit: Payer: Self-pay | Admitting: Medical

## 2023-11-05 NOTE — Telephone Encounter (Signed)
 Called patient to schedule cpe per pt she states she get her physicals with [facility], they told her we should set up med check, per patient she does not feel it is necessary

## 2023-11-05 NOTE — Telephone Encounter (Signed)
 Patient is overdue for a CPE with Ludie. Please schedule.

## 2023-12-03 ENCOUNTER — Other Ambulatory Visit: Payer: Self-pay | Admitting: Medical

## 2023-12-31 ENCOUNTER — Other Ambulatory Visit: Payer: Self-pay | Admitting: Medical

## 2023-12-31 NOTE — Telephone Encounter (Signed)
 Pt is overdue for a visit. Please schedule. I will refill for 30 days

## 2023-12-31 NOTE — Telephone Encounter (Signed)
 Spoke to pt and advised she is over due for appt with Ludie. Advised pt we received refill request for albuterol , pt states she did not request it and is not going to schedule an appt for him to tell her what she already knows and charge her

## 2024-01-23 ENCOUNTER — Other Ambulatory Visit: Payer: Self-pay | Admitting: Medical

## 2024-02-19 ENCOUNTER — Other Ambulatory Visit: Payer: Self-pay | Admitting: Medical

## 2024-03-11 ENCOUNTER — Ambulatory Visit: Admitting: Medical

## 2024-03-11 VITALS — BP 110/70 | HR 68 | Temp 98.3°F | Wt 175.6 lb

## 2024-03-11 DIAGNOSIS — R0989 Other specified symptoms and signs involving the circulatory and respiratory systems: Secondary | ICD-10-CM

## 2024-03-11 DIAGNOSIS — R0981 Nasal congestion: Secondary | ICD-10-CM

## 2024-03-11 DIAGNOSIS — R062 Wheezing: Secondary | ICD-10-CM

## 2024-03-11 DIAGNOSIS — J4541 Moderate persistent asthma with (acute) exacerbation: Secondary | ICD-10-CM

## 2024-03-11 MED ORDER — BUDESONIDE-FORMOTEROL FUMARATE 80-4.5 MCG/ACT IN AERO: 2.0000 | INHALATION_SPRAY | Freq: Two times a day (BID) | RESPIRATORY_TRACT | 2 refills | Status: AC

## 2024-03-11 MED ORDER — FLUTICASONE PROPIONATE 50 MCG/ACT NA SUSP: 2.0000 | Freq: Every day | NASAL | 1 refills | Status: AC

## 2024-03-11 NOTE — Patient Instructions (Addendum)
 Check with your insurance about the cheapest maintenance inhaler option  Options include: Symbicort  Dulera Advair Qvar Asmanex Bretrzi   Asthma with acute exacerbation Asthma exacerbation with congestion and wheezing, likely triggered by cold weather and environmental factors. No indication for prednisone . - Use Symbicort  twice daily for two weeks. - Rinse mouth after using Symbicort . - Increase water  intake. - Avoid asthma triggers such as chemicals and smoke. - Consider prednisone  if symptoms worsen.  Allergic rhinitis Nasal congestion and swelling due to environmental allergens and cold weather. Symptoms suggest allergy rather than infection. - Use Flonase  nasal spray daily for one to two weeks. - Consider Mucinex Max/Afrin at night for short-term relief, not exceeding three days. - Perform nasal saline flushes. - Consider Sudafed or phenylephrine for additional decongestion if needed. - Avoid Zyrtec  Gold Top due to adverse effects.

## 2024-03-11 NOTE — Progress Notes (Signed)
 Subjective:  Claudia Hendrix is a 48 y.o. female who presents for Chief Complaint  Patient presents with   Acute Visit    Congestion x2 weeks,wheezing, been using albuterol  inhaler, nasal spray, mucinex     Claudia Hendrix is a 48 year old female with asthma who presents with substantial congestion and wheezing.  She has been experiencing substantial congestion and wheezing for the past two weeks. No fever, body aches, chills, ear pain, or sore throat. Nasal discharge occasionally changes color but mostly remains clear. She has been using her albuterol  inhaler at least once a day during this period.  She has a history of asthma and typically uses Advair as a preventative inhaler. Recently, she has been using Advair twice a day for the past week due to increased symptoms. She also uses a nebulizer at home and has felt wheezy in the last few days.  She reports that her symptoms vary depending on exposure to cleaning products and cold weather. She has attempted to reduce exposure to triggers by using natural products and indoor plants.   In terms of medication, she has used gold top Zyrtec  recently but stopped due to worsening nasal symptoms. She is not currently on any allergy pills and does not have blood pressure issues. She has not used prednisone  recently and prefers to manage her symptoms with inhalers and nasal sprays.  No other aggravating or relieving factors.    No other c/o.  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 1 each 2   albuterol   (PROVENTIL ) (2.5 MG/3ML) 0.083% nebulizer solution USE 1 VIAL IN NEBULIZER EVERY 6 HOURS AS NEEDED FOR WHEEZING OR SHORTNESS OF BREATH (Patient not taking: Reported on 03/11/2024) 75 mL 1   No current facility-administered medications on file prior to visit.    The following portions of the patient's history were reviewed and updated as appropriate: allergies, current medications, past family history, past medical history, past social history, past surgical history and problem list.  ROS Otherwise as in subjective above  Objective: BP 110/70   Pulse 68   Temp 98.3 F (36.8 C)   Wt 175 lb 9.6 oz (79.7 kg)   LMP 03/29/2017   SpO2 98%   BMI 35.47 kg/m   General appearance: alert, no distress, well developed, well nourished HEENT: normocephalic, sclerae anicteric, conjunctiva pink and moist, TMs pearly, nares with boggy tubrinate, swollen, but no erythema, pharynx normal Oral cavity: MMM, no lesions Neck: supple, no lymphadenopathy, no thyromegaly, no masses Heart: RRR, normal S1, S2, no murmurs Lungs: CTA bilaterally, no wheezes, rhonchi, or rales Pulses: 2+ radial pulses, 2+ pedal pulses, normal cap refill Ext: no edema   Assessment: Encounter Diagnoses  Name Primary?   Moderate persistent asthma with acute exacerbation Yes   Nasal congestion    Wheezing    Itching sensation of throat      Plan: Asthma with acute exacerbation Asthma exacerbation with congestion and wheezing, likely triggered by cold weather  and environmental factors. No indication for prednisone . - Use Symbicort  twice daily for two weeks. - Rinse mouth after using Symbicort . -Continue albuterol  either nebulizer or handheld inhaler for symptoms as needed - Increase water  intake. - Avoid asthma triggers such as chemicals and smoke. - Consider prednisone  if symptoms worsen.  Allergic rhinitis Nasal congestion and swelling due to environmental allergens and cold weather. Symptoms suggest allergy rather  than infection. - Use Flonase  nasal spray daily for one to two weeks. - Consider mucinex max at night for short-term relief, not exceeding three days. - consider nasal saline flushes. - Consider Sudafed or phenylephrine for additional decongestion if needed. - Avoid Zyrtec  Gold Top due to adverse effects.   If not much improved or worse over the next 2 days call back or recheck   Claudia Hendrix was seen today for acute visit.  Diagnoses and all orders for this visit:  Moderate persistent asthma with acute exacerbation  Nasal congestion  Wheezing  Itching sensation of throat  Other orders -     budesonide -formoterol  (SYMBICORT ) 80-4.5 MCG/ACT inhaler; Inhale 2 puffs into the lungs 2 (two) times daily. -     fluticasone  (FLONASE ) 50 MCG/ACT nasal spray; Place 2 sprays into both nostrils daily.    Follow up: prn

## 2024-03-16 ENCOUNTER — Other Ambulatory Visit: Payer: Self-pay | Admitting: Medical

## 2024-04-13 ENCOUNTER — Other Ambulatory Visit: Payer: Self-pay | Admitting: Medical
